# Patient Record
Sex: Female | Born: 1985 | Race: Black or African American | Hispanic: No | Marital: Single | Smoking: Never smoker
Health system: Southern US, Community
[De-identification: ages and names within clinical notes are randomized; demographics above are authoritative.]

## PROBLEM LIST (undated history)

## (undated) DIAGNOSIS — R1030 Lower abdominal pain, unspecified: Secondary | ICD-10-CM

## (undated) DIAGNOSIS — F419 Anxiety disorder, unspecified: Secondary | ICD-10-CM

## (undated) DIAGNOSIS — F32A Depression, unspecified: Secondary | ICD-10-CM

## (undated) DIAGNOSIS — M797 Fibromyalgia: Secondary | ICD-10-CM

## (undated) DIAGNOSIS — R079 Chest pain, unspecified: Secondary | ICD-10-CM

## (undated) HISTORY — DX: Chest pain, unspecified: R07.9

## (undated) HISTORY — DX: Lower abdominal pain, unspecified: R10.30

## (undated) HISTORY — PX: TUBAL LIGATION: SHX77

## (undated) HISTORY — PX: OTHER SURGICAL HISTORY: SHX169

## (undated) HISTORY — PX: CARPAL TUNNEL RELEASE: SHX101

---

## 2007-07-17 DIAGNOSIS — F32A Depression, unspecified: Secondary | ICD-10-CM | POA: Insufficient documentation

## 2008-10-15 DIAGNOSIS — H5710 Ocular pain, unspecified eye: Secondary | ICD-10-CM | POA: Insufficient documentation

## 2009-08-02 DIAGNOSIS — J029 Acute pharyngitis, unspecified: Secondary | ICD-10-CM | POA: Insufficient documentation

## 2009-08-02 DIAGNOSIS — Z23 Encounter for immunization: Secondary | ICD-10-CM | POA: Insufficient documentation

## 2009-08-02 DIAGNOSIS — F439 Reaction to severe stress, unspecified: Secondary | ICD-10-CM | POA: Insufficient documentation

## 2009-08-02 DIAGNOSIS — G4700 Insomnia, unspecified: Secondary | ICD-10-CM | POA: Insufficient documentation

## 2009-08-02 DIAGNOSIS — E669 Obesity, unspecified: Secondary | ICD-10-CM | POA: Insufficient documentation

## 2009-08-02 DIAGNOSIS — B353 Tinea pedis: Secondary | ICD-10-CM | POA: Insufficient documentation

## 2009-09-24 DIAGNOSIS — F419 Anxiety disorder, unspecified: Secondary | ICD-10-CM | POA: Insufficient documentation

## 2009-09-30 DIAGNOSIS — S62609A Fracture of unspecified phalanx of unspecified finger, initial encounter for closed fracture: Secondary | ICD-10-CM | POA: Insufficient documentation

## 2009-10-19 DIAGNOSIS — J309 Allergic rhinitis, unspecified: Secondary | ICD-10-CM | POA: Insufficient documentation

## 2009-12-13 DIAGNOSIS — E669 Obesity, unspecified: Secondary | ICD-10-CM | POA: Insufficient documentation

## 2010-02-10 DIAGNOSIS — J9801 Acute bronchospasm: Secondary | ICD-10-CM | POA: Insufficient documentation

## 2010-03-11 DIAGNOSIS — R519 Headache, unspecified: Secondary | ICD-10-CM | POA: Insufficient documentation

## 2011-07-28 DIAGNOSIS — E785 Hyperlipidemia, unspecified: Secondary | ICD-10-CM | POA: Insufficient documentation

## 2013-11-20 ENCOUNTER — Emergency Department (HOSPITAL_COMMUNITY)
Admission: EM | Admit: 2013-11-20 | Discharge: 2013-11-20 | Disposition: A | Discharge status: Home / Self Care | Payer: Medicaid Other | Attending: Emergency Medicine | Admitting: Emergency Medicine

## 2013-11-20 ENCOUNTER — Encounter (HOSPITAL_COMMUNITY): Payer: Self-pay | Admitting: Emergency Medicine

## 2013-11-20 DIAGNOSIS — R21 Rash and other nonspecific skin eruption: Secondary | ICD-10-CM | POA: Insufficient documentation

## 2013-11-20 DIAGNOSIS — L509 Urticaria, unspecified: Secondary | ICD-10-CM | POA: Insufficient documentation

## 2013-11-20 DIAGNOSIS — Z79899 Other long term (current) drug therapy: Secondary | ICD-10-CM | POA: Insufficient documentation

## 2013-11-20 MED ORDER — PREDNISONE 10 MG PO TABS: ORAL_TABLET | ORAL | Status: DC

## 2013-11-20 MED ORDER — HYDROXYZINE HCL 25 MG PO TABS: 25.0000 mg | ORAL_TABLET | Freq: Four times a day (QID) | ORAL | Status: DC

## 2013-11-20 NOTE — Discharge Instructions (Signed)
Hives Hives are itchy, red, swollen areas of the skin. They can vary in size and location on your body. Hives can come and go for hours or several days (acute hives) or for several weeks (chronic hives). Hives do not spread from person to person (noncontagious). They may get worse with scratching, exercise, and emotional stress. CAUSES   Allergic reaction to food, additives, or drugs.  Infections, including the common cold.  Illness, such as vasculitis, lupus, or thyroid disease.  Exposure to sunlight, heat, or cold.  Exercise.  Stress.  Contact with chemicals. SYMPTOMS   Red or white swollen patches on the skin. The patches may change size, shape, and location quickly and repeatedly.  Itching.  Swelling of the hands, feet, and face. This may occur if hives develop deeper in the skin. DIAGNOSIS  Your caregiver can usually tell what is wrong by performing a physical exam. Skin or blood tests may also be done to determine the cause of your hives. In some cases, the cause cannot be determined. TREATMENT  Mild cases usually get better with medicines such as antihistamines. Severe cases may require an emergency epinephrine injection. If the cause of your hives is known, treatment includes avoiding that trigger.  HOME CARE INSTRUCTIONS   Avoid causes that trigger your hives.  Take antihistamines as directed by your caregiver to reduce the severity of your hives. Non-sedating or low-sedating antihistamines are usually recommended. Do not drive while taking an antihistamine.  Take any other medicines prescribed for itching as directed by your caregiver.  Wear loose-fitting clothing.  Keep all follow-up appointments as directed by your caregiver. SEEK MEDICAL CARE IF:   You have persistent or severe itching that is not relieved with medicine.  You have painful or swollen joints. SEEK IMMEDIATE MEDICAL CARE IF:   You have a fever.  Your tongue or lips are swollen.  You have  trouble breathing or swallowing.  You feel tightness in the throat or chest.  You have abdominal pain. These problems may be the first sign of a life-threatening allergic reaction. Call your local emergency services (911 in [place] MAKE SURE YOU:   Understand these instructions.  Will watch your condition.  Will get help right away if you are not doing well or get worse. Document Released: 01/23/2005 Document Revised: 01/28/2013 Document Reviewed: 04/18/2011 Exit[employer] Patient Information 2015 [employer]. This information is not intended to replace advice given to you by your health care provider. Make sure you discuss any questions you have with your health care provider.  

## 2013-11-20 NOTE — ED Notes (Signed)
"  I have night hives",  Says she breaks out with hives at night , No relief with benadryl. Alert, NO sob. No difficulty swallowing

## 2013-11-21 NOTE — ED Provider Notes (Signed)
CSN: [id number]     Arrival date & time 11/20/13  1249 History   First MD Initiated Contact with Patient 11/20/13 1314     No chief complaint on file.    (Consider location/radiation/quality/duration/timing/severity/associated sxs/prior Treatment) The history is provided by the patient.   Theresa Holt is a 28 y.o. female presenting for treatment of hives.  She has been breaking out in itchy hives every night for the past week.  She has a history of similar symptoms which occurred last fall as well,  Lasting for about a month then resolving and she and her pcp were unable to determine the cause.  She denies any changes in soaps, lotions, chemical, environment, foods.  She also denies facial, mouth, lip, tongue edema, cough, sob.  She has taken benadryl with minimal relief of itching.  She currently has a small persistent patch of rash on her chest, but states had allover hives last night.    History reviewed. No pertinent past medical history. Past Surgical History  Procedure Laterality Date  . Cesarean section    . Tubal ligation     History reviewed. No pertinent family history. History  Substance Use Topics  . Smoking status: Never Smoker   . Smokeless tobacco: Not on file  . Alcohol Use: No   OB History   Grav Para Term Preterm Abortions TAB SAB Ect Mult Living                 Review of Systems  Constitutional: Negative for fever and chills.  HENT: Negative for facial swelling.   Respiratory: Negative for shortness of breath and wheezing.   Skin: Positive for rash.  Neurological: Negative for numbness.      Allergies  Codeine  Home Medications   Prior to Admission medications   Medication Sig Start Date End Date Taking? Authorizing Provider  diphenhydrAMINE (BENADRYL) 25 MG tablet Take 50 mg by mouth at bedtime as needed for itching or sleep.   Yes Historical Provider, MD  Linaclotide (LINZESS) 290 MCG CAPS capsule Take 290 mcg by mouth daily.   Yes Historical  Provider, MD  hydrOXYzine (ATARAX/VISTARIL) 25 MG tablet Take 1 tablet (25 mg total) by mouth every 6 (six) hours. 11/20/13   Burgess AmorJulie Karime Scheuermann, PA-C  predniSONE (DELTASONE) 10 MG tablet 6, 5, 4, 3, 2 then 1 tablet by mouth daily for 6 days total. 11/20/13   Burgess AmorJulie Janei Scheff, PA-C   BP 122/76  Pulse 86  Temp(Src) 99.1 F (37.3 C) (Oral)  Resp 14  SpO2 97%  LMP 11/12/2013 Physical Exam  Nursing note and vitals reviewed. Constitutional: She appears well-developed and well-nourished.  HENT:  Head: Normocephalic and atraumatic.  Eyes: Conjunctivae are normal.  Neck: Normal range of motion.  Cardiovascular: Normal rate, regular rhythm, normal heart sounds and intact distal pulses.   Pulmonary/Chest: Effort normal and breath sounds normal. No stridor. She has no wheezes.  Abdominal: Soft. Bowel sounds are normal. There is no tenderness.  Musculoskeletal: Normal range of motion.  Neurological: She is alert.  Skin: Skin is warm and dry. Rash noted.  Small patch of hive like raised rash left upper chest wall.  Psychiatric: She has a normal mood and affect.    ED Course  Procedures (including critical care time) Labs Review Labs Reviewed - No data to display  Imaging Review No results found.   EKG Interpretation None      MDM   Final diagnoses:  Hives    The patient appears reasonably  screened and/or stabilized for discharge and I doubt any other medical condition or other [facility] requiring further screening, evaluation, or treatment in the ED at this time prior to discharge.  She was placed on prednisone taper, atarax in place of benadryl, suggested pepcid bid for the next week.  F/u with pcp prn, returning here for any worsened sx.  Pt understands and agrees with plan.    Burgess AmorJulie Zorana Brockwell, PA-C 11/21/13 [phone]

## 2013-11-24 NOTE — ED Provider Notes (Signed)
History/physical exam/procedure(s) were performed by non-physician practitioner and as supervising physician I was immediately available for consultation/collaboration. I have reviewed all notes and am in agreement with care and plan.   Hilario Quarryanielle S Nichoel Digiulio, MD 11/24/13 [id number]

## 2016-03-03 DIAGNOSIS — Z3141 Encounter for fertility testing: Secondary | ICD-10-CM | POA: Insufficient documentation

## 2016-11-23 ENCOUNTER — Emergency Department (HOSPITAL_COMMUNITY)
Admission: EM | Admit: 2016-11-23 | Discharge: 2016-11-23 | Disposition: A | Discharge status: Home / Self Care | Payer: Self-pay | Attending: Emergency Medicine | Admitting: Emergency Medicine

## 2016-11-23 ENCOUNTER — Emergency Department (HOSPITAL_COMMUNITY): Payer: Self-pay

## 2016-11-23 ENCOUNTER — Encounter (HOSPITAL_COMMUNITY): Payer: Self-pay | Admitting: Emergency Medicine

## 2016-11-23 DIAGNOSIS — Z79899 Other long term (current) drug therapy: Secondary | ICD-10-CM | POA: Insufficient documentation

## 2016-11-23 DIAGNOSIS — R0789 Other chest pain: Secondary | ICD-10-CM | POA: Insufficient documentation

## 2016-11-23 LAB — COMPREHENSIVE METABOLIC PANEL
ALT: 18 U/L (ref 14–54)
AST: 20 U/L (ref 15–41)
Albumin: 3.5 g/dL (ref 3.5–5.0)
Alkaline Phosphatase: 85 U/L (ref 38–126)
Anion gap: 9 (ref 5–15)
BILIRUBIN TOTAL: 0.3 mg/dL (ref 0.3–1.2)
BUN: 11 mg/dL (ref 6–20)
CALCIUM: 9 mg/dL (ref 8.9–10.3)
CO2: 27 mmol/L (ref 22–32)
CREATININE: 0.79 mg/dL (ref 0.44–1.00)
Chloride: 105 mmol/L (ref 101–111)
Glucose, Bld: 120 mg/dL — ABNORMAL HIGH (ref 65–99)
Potassium: 3.3 mmol/L — ABNORMAL LOW (ref 3.5–5.1)
Sodium: 141 mmol/L (ref 135–145)
TOTAL PROTEIN: 7.4 g/dL (ref 6.5–8.1)

## 2016-11-23 LAB — CBC WITH DIFFERENTIAL/PLATELET
Basophils Absolute: 0 10*3/uL (ref 0.0–0.1)
Basophils Relative: 0 %
EOS PCT: 2 %
Eosinophils Absolute: 0.2 10*3/uL (ref 0.0–0.7)
HEMATOCRIT: 38.6 % (ref 36.0–46.0)
Hemoglobin: 11.9 g/dL — ABNORMAL LOW (ref 12.0–15.0)
LYMPHS PCT: 27 %
Lymphs Abs: 2.4 10*3/uL (ref 0.7–4.0)
MCH: 26.2 pg (ref 26.0–34.0)
MCHC: 30.8 g/dL (ref 30.0–36.0)
MCV: 85 fL (ref 78.0–100.0)
Monocytes Absolute: 0.4 10*3/uL (ref 0.1–1.0)
Monocytes Relative: 5 %
NEUTROS ABS: 5.8 10*3/uL (ref 1.7–7.7)
Neutrophils Relative %: 66 %
Platelets: 353 10*3/uL (ref 150–400)
RBC: 4.54 MIL/uL (ref 3.87–5.11)
RDW: 14 % (ref 11.5–15.5)
WBC: 8.9 10*3/uL (ref 4.0–10.5)

## 2016-11-23 LAB — D-DIMER, QUANTITATIVE (NOT AT ARMC): D-Dimer, Quant: 0.48 ug/mL-FEU (ref 0.00–0.50)

## 2016-11-23 LAB — TROPONIN I

## 2016-11-23 MED ORDER — HYDROCODONE-ACETAMINOPHEN 5-325 MG PO TABS
1.0000 | ORAL_TABLET | Freq: Once | ORAL | Status: AC
  Administered 2016-11-23: 1 via ORAL
  Filled 2016-11-23: qty 1

## 2016-11-23 MED ORDER — IBUPROFEN 800 MG PO TABS: 800.0000 mg | ORAL_TABLET | Freq: Three times a day (TID) | ORAL | 0 refills | Status: DC

## 2016-11-23 MED ORDER — TRAMADOL HCL 50 MG PO TABS: 50.0000 mg | ORAL_TABLET | Freq: Four times a day (QID) | ORAL | 0 refills | Status: DC | PRN

## 2016-11-23 NOTE — ED Provider Notes (Signed)
[facility] EMERGENCY DEPARTMENT Provider Note   CSN: [id number] Arrival date & time: 11/23/16  1057     History   Chief Complaint Chief Complaint  Patient presents with  . Back Pain    HPI Theresa Holt is a 31 y.o. female.    Patient complains of left-sided chest pain and left upper back pain   The history is provided by the patient. No language interpreter was used.  Back Pain   This is a new problem. The current episode started yesterday. The problem occurs constantly. The problem has not changed since onset.The pain is associated with no known injury. Pain location: Left chest and left upper back. The quality of the pain is described as aching. The pain does not radiate. The pain is at a severity of 4/10. The pain is moderate. The symptoms are aggravated by twisting. The pain is the same all the time. Pertinent negatives include no chest pain, no headaches and no abdominal pain.    No past medical history on file.  There are no active problems to display for this patient.   Past Surgical History:  Procedure Laterality Date  . CESAREAN SECTION    . TUBAL LIGATION      OB History    No data available       Home Medications    Prior to Admission medications   Medication Sig Start Date End Date Taking? Authorizing Provider  diphenhydrAMINE (BENADRYL) 25 MG tablet Take 50 mg by mouth at bedtime as needed for itching or sleep.   Yes [provider]  letrozole (FEMARA) 2.5 MG tablet Take 2.5 mg by mouth daily.   Yes [provider]  metFORMIN (GLUCOPHAGE) 500 MG tablet Take 500 mg by mouth 2 (two) times daily with a meal.   Yes [provider]  PRESCRIPTION MEDICATION HCG shot once a month for fertility   Yes [provider]  ibuprofen (ADVIL,MOTRIN) 800 MG tablet Take 1 tablet (800 mg total) by mouth 3 (three) times daily. 11/23/16   Theresa Holt, Theresa Germani, MD  predniSONE (DELTASONE) 10 MG tablet 6, 5, 4, 3, 2 then 1 tablet by mouth  daily for 6 days total. 11/20/13   Idol, Raynelle FanningJulie, PA-C  traMADol (ULTRAM) 50 MG tablet Take 1 tablet (50 mg total) by mouth every 6 (six) hours as needed. 11/23/16   Theresa Holt, Greco Gastelum, MD    Family History No family history on file.  Social History Social History  Substance Use Topics  . Smoking status: Never Smoker  . Smokeless tobacco: Never Used  . Alcohol use No     Allergies   Codeine   Review of Systems Review of Systems  Constitutional: Negative for appetite change and fatigue.  HENT: Negative for congestion, ear discharge and sinus pressure.   Eyes: Negative for discharge.  Respiratory: Negative for cough.   Cardiovascular: Negative for chest pain.  Gastrointestinal: Negative for abdominal pain and diarrhea.  Genitourinary: Negative for frequency and hematuria.  Musculoskeletal: Positive for back pain.  Skin: Negative for rash.  Neurological: Negative for seizures and headaches.  Psychiatric/Behavioral: Negative for hallucinations.     Physical Exam Updated Vital Signs BP 128/88   Pulse 81   Temp 97.8 F (36.6 C) (Temporal)   Resp 20   Ht 5\' 4"  (1.626 m)   Wt 103 kg (227 lb)   LMP 11/15/2016   SpO2 100%   BMI 38.96 kg/m   Physical Exam  Constitutional: She is oriented to person, place, and  time. She appears well-developed.  HENT:  Head: Normocephalic.  Eyes: Conjunctivae and EOM are normal. No scleral icterus.  Neck: Neck supple. No thyromegaly present.  Cardiovascular: Normal rate and regular rhythm.  Exam reveals no gallop and no friction rub.   No murmur heard. Pulmonary/Chest: No stridor. She has no wheezes. She has no rales. She exhibits tenderness.  Abdominal: She exhibits no distension. There is no tenderness. There is no rebound.  Musculoskeletal: Normal range of motion. She exhibits no edema.  Tenderness to left trapezius muscle  Lymphadenopathy:    She has no cervical adenopathy.  Neurological: She is oriented to person, place, and time.  She exhibits normal muscle tone. Coordination normal.  Skin: No rash noted. No erythema.  Psychiatric: She has a normal mood and affect. Her behavior is normal.     ED Treatments / Results  Labs (all labs ordered are listed, but only abnormal results are displayed) Labs Reviewed  CBC WITH DIFFERENTIAL/PLATELET - Abnormal; Notable for the following:       Result Value   Hemoglobin 11.9 (*)    All other components within normal limits  COMPREHENSIVE METABOLIC PANEL - Abnormal; Notable for the following:    Potassium 3.3 (*)    Glucose, Bld 120 (*)    All other components within normal limits  D-DIMER, QUANTITATIVE (NOT AT [facility])  TROPONIN I    EKG  EKG Interpretation None       Radiology Dg Chest 2 View  Result Date: 11/23/2016 CLINICAL DATA:  LEFT chest pain radiating to back for 1 day. EXAM: CHEST  2 VIEW COMPARISON:  None. FINDINGS: The heart size and mediastinal contours are within normal limits. Both lungs are clear. Mild diaphragm hepatic eventration. The visualized skeletal structures are unremarkable. IMPRESSION: Normal chest. Electronically Signed   By: Awilda Metro M.D.   On: 11/23/2016 13:35    Procedures Procedures (including critical care time)  Medications Ordered in ED Medications  HYDROcodone-acetaminophen (NORCO/VICODIN) 5-325 MG per tablet 1 tablet (1 tablet Oral Given 11/23/16 1436)     Initial Impression / Assessment and Plan / ED Course  I have reviewed the triage vital signs and the nursing notes.  Pertinent labs & imaging results that were available during my care of the patient were reviewed by me and considered in my medical decision making (see chart for details).   trapezius muscle strain. Patient will be treated with Motrin and Ultram will follow-up as needed    Final Clinical Impressions(s) / ED Diagnoses   Final diagnoses:  Chest wall pain    New Prescriptions New Prescriptions   IBUPROFEN (ADVIL,MOTRIN) 800 MG TABLET     Take 1 tablet (800 mg total) by mouth 3 (three) times daily.   TRAMADOL (ULTRAM) 50 MG TABLET    Take 1 tablet (50 mg total) by mouth every 6 (six) hours as needed.     Theresa Berkshire, MD 11/23/16 [phone]

## 2016-11-23 NOTE — ED Triage Notes (Signed)
Up on waking the second time this morning , left sided back pain, radiating around into shoulder and upper chest, worse with movement,

## 2016-11-23 NOTE — Discharge Instructions (Signed)
Follow up if not improving

## 2019-09-13 ENCOUNTER — Emergency Department (HOSPITAL_COMMUNITY)
Admission: EM | Admit: 2019-09-13 | Discharge: 2019-09-14 | Disposition: A | Discharge status: Home / Self Care | Payer: Medicaid Other | Attending: Emergency Medicine | Admitting: Emergency Medicine

## 2019-09-13 ENCOUNTER — Encounter (HOSPITAL_COMMUNITY): Payer: Self-pay | Admitting: Emergency Medicine

## 2019-09-13 ENCOUNTER — Other Ambulatory Visit: Payer: Self-pay

## 2019-09-13 DIAGNOSIS — O2341 Unspecified infection of urinary tract in pregnancy, first trimester: Secondary | ICD-10-CM | POA: Diagnosis not present

## 2019-09-13 DIAGNOSIS — Z79899 Other long term (current) drug therapy: Secondary | ICD-10-CM | POA: Insufficient documentation

## 2019-09-13 DIAGNOSIS — N390 Urinary tract infection, site not specified: Secondary | ICD-10-CM

## 2019-09-13 DIAGNOSIS — R1032 Left lower quadrant pain: Secondary | ICD-10-CM | POA: Insufficient documentation

## 2019-09-13 DIAGNOSIS — Z3490 Encounter for supervision of normal pregnancy, unspecified, unspecified trimester: Secondary | ICD-10-CM

## 2019-09-13 DIAGNOSIS — Z3A01 Less than 8 weeks gestation of pregnancy: Secondary | ICD-10-CM | POA: Insufficient documentation

## 2019-09-13 DIAGNOSIS — O26891 Other specified pregnancy related conditions, first trimester: Secondary | ICD-10-CM | POA: Diagnosis present

## 2019-09-13 LAB — COMPREHENSIVE METABOLIC PANEL
ALT: 22 U/L (ref 0–44)
AST: 19 U/L (ref 15–41)
Albumin: 3.8 g/dL (ref 3.5–5.0)
Alkaline Phosphatase: 75 U/L (ref 38–126)
Anion gap: 8 (ref 5–15)
BUN: 10 mg/dL (ref 6–20)
CO2: 22 mmol/L (ref 22–32)
Calcium: 9.1 mg/dL (ref 8.9–10.3)
Chloride: 107 mmol/L (ref 98–111)
Creatinine, Ser: 0.88 mg/dL (ref 0.44–1.00)
GFR calc Af Amer: >60 mL/min (ref >60)
GFR calc non Af Amer: >60 mL/min (ref >60)
Glucose, Bld: 91 mg/dL (ref 70–99)
Potassium: 3.5 mmol/L (ref 3.5–5.1)
Sodium: 137 mmol/L (ref 135–145)
Total Bilirubin: 0.3 mg/dL (ref 0.3–1.2)
Total Protein: 7.7 g/dL (ref 6.5–8.1)

## 2019-09-13 LAB — CBC
HCT: 39.7 % (ref 36.0–46.0)
Hemoglobin: 12.3 g/dL (ref 12.0–15.0)
MCH: 27.3 pg (ref 26.0–34.0)
MCHC: 31 g/dL (ref 30.0–36.0)
MCV: 88.2 fL (ref 80.0–100.0)
Platelets: 337 10*3/uL (ref 150–400)
RBC: 4.5 MIL/uL (ref 3.87–5.11)
RDW: 13.7 % (ref 11.5–15.5)
WBC: 12.2 10*3/uL — ABNORMAL HIGH (ref 4.0–10.5)
nRBC: 0 % (ref 0.0–0.2)

## 2019-09-13 LAB — URINALYSIS, ROUTINE W REFLEX MICROSCOPIC
Bacteria, UA: NONE SEEN
Bilirubin Urine: NEGATIVE
Glucose, UA: NEGATIVE mg/dL
Ketones, ur: NEGATIVE mg/dL
Nitrite: NEGATIVE
Protein, ur: NEGATIVE mg/dL
Specific Gravity, Urine: 1.023 (ref 1.005–1.030)
pH: 5 (ref 5.0–8.0)

## 2019-09-13 LAB — LIPASE, BLOOD: Lipase: 29 U/L (ref 11–51)

## 2019-09-13 LAB — POC URINE PREG, ED: Preg Test, Ur: POSITIVE — AB

## 2019-09-13 NOTE — ED Triage Notes (Signed)
Pt c/o lower left abd pain that started last night, also c/o vomiting.

## 2019-09-14 ENCOUNTER — Emergency Department (HOSPITAL_COMMUNITY): Payer: Medicaid Other

## 2019-09-14 LAB — HCG, QUANTITATIVE, PREGNANCY: hCG, Beta Chain, Quant, S: 242 m[IU]/mL — ABNORMAL HIGH (ref <5)

## 2019-09-14 LAB — ABO/RH: ABO/RH(D): B POS

## 2019-09-14 MED ORDER — CEPHALEXIN 500 MG PO CAPS: 500.0000 mg | ORAL_CAPSULE | Freq: Three times a day (TID) | ORAL | 0 refills | Status: DC

## 2019-09-14 MED ORDER — CEPHALEXIN 500 MG PO CAPS
500.0000 mg | ORAL_CAPSULE | Freq: Once | ORAL | Status: AC
  Administered 2019-09-14: 500 mg via ORAL
  Filled 2019-09-14: qty 1

## 2019-09-14 NOTE — ED Provider Notes (Signed)
[facility] EMERGENCY DEPARTMENT Provider Note   CSN: [id number] Arrival date & time: 09/13/19  2038   Time seen 12:10 AM  History Chief Complaint  Patient presents with  . Abdominal Pain    Theresa Holt is a 34 y.o. female.  HPI   Patient is G5, P3 Ab1 (she thinks she was pregnant in December because she had a positive pregnancy test however she never missed a period).  She is currently being followed by Dr. Almond Lint.  She had laparoscopic removal of her right tube in October and reversal of her left tubal ligation in October.  She had a small area of endometriosis or scar tissue on the right tube.  She states her last normal period started June 24.  She has had a positive pregnancy test.  She states she started having left lower quadrant pain on the night of August 6.  It has been there constantly and is sharp.  It gets worse with changing positions and nothing makes it feel better.  She denies vaginal bleeding.  She has some nausea which she has had before the pain started without vomiting.  She denies fever, dysuria, or hematuria.  She has had frequency for about 2 days.  She has a follow-up appointment with her fertility doctor on August 10 at [facility].  She states she has had some serial beta hCGs done by Dr. Ralph Dowdy her OB in [place].  She does not know her blood type.  PCP System, Pcp Not In   PCP System, Pcp Not In   No past medical history on file.  There are no problems to display for this patient.   Past Surgical History:  Procedure Laterality Date  . CESAREAN SECTION    . TUBAL LIGATION    . tubal reversal       OB History   No obstetric history on file.     No family history on file.  Social History   Tobacco Use  . Smoking status: Never Smoker  . Smokeless tobacco: Never Used  Substance Use Topics  . Alcohol use: No  . Drug use: No    Home Medications Prior to Admission medications   Medication Sig Start Date End Date Taking? Authorizing  Provider  cephALEXin (KEFLEX) 500 MG capsule Take 1 capsule (500 mg total) by mouth 3 (three) times daily. 09/14/19   Devoria Albe, MD  diphenhydrAMINE (BENADRYL) 25 MG tablet Take 50 mg by mouth at bedtime as needed for itching or sleep.    [provider]  ibuprofen (ADVIL,MOTRIN) 800 MG tablet Take 1 tablet (800 mg total) by mouth 3 (three) times daily. 11/23/16   Bethann Berkshire, MD  letrozole [facility]) 2.5 MG tablet Take 2.5 mg by mouth daily.    [provider]  metFORMIN (GLUCOPHAGE) 500 MG tablet Take 500 mg by mouth 2 (two) times daily with a meal.    [provider]  predniSONE (DELTASONE) 10 MG tablet 6, 5, 4, 3, 2 then 1 tablet by mouth daily for 6 days total. 11/20/13   Idol, Raynelle Fanning, PA-C  PRESCRIPTION MEDICATION HCG shot once a month for fertility    [provider]  traMADol (ULTRAM) 50 MG tablet Take 1 tablet (50 mg total) by mouth every 6 (six) hours as needed. 11/23/16   Bethann Berkshire, MD  Patient states she stopped all medications when she became pregnant  Allergies    Codeine  Review of Systems   Review of Systems  All other  systems reviewed and are negative.   Physical Exam Updated Vital Signs BP 128/83   Pulse 86   Temp 98.4 F (36.9 C)   Resp 16   Ht 5\' 4"  (1.626 m)   Wt 99.3 kg   SpO2 100%   BMI 37.59 kg/m   Physical Exam Vitals and nursing note reviewed.  Constitutional:      Appearance: Normal appearance. She is obese.  HENT:     Head: Normocephalic and atraumatic.     Right Ear: External ear normal.     Left Ear: External ear normal.  Eyes:     Extraocular Movements: Extraocular movements intact.     Conjunctiva/sclera: Conjunctivae normal.     Pupils: Pupils are equal, round, and reactive to light.  Cardiovascular:     Rate and Rhythm: Normal rate and regular rhythm.  Pulmonary:     Effort: Pulmonary effort is normal. No respiratory distress.     Breath sounds: Normal breath sounds.  Abdominal:     General:  Bowel sounds are normal.     Palpations: Abdomen is soft.     Tenderness: There is abdominal tenderness in the left lower quadrant. There is no guarding or rebound.    Musculoskeletal:        General: Normal range of motion.     Cervical back: Normal range of motion.  Skin:    General: Skin is warm and dry.  Neurological:     General: No focal deficit present.     Mental Status: She is alert and oriented to person, place, and time.     Cranial Nerves: No cranial nerve deficit.  Psychiatric:        Mood and Affect: Mood normal.        Behavior: Behavior normal.        Thought Content: Thought content normal.     ED Results / Procedures / Treatments   Labs (all labs ordered are listed, but only abnormal results are displayed) Results for orders placed or performed during the hospital encounter of 09/13/19  Lipase, blood  Result Value Ref Range   Lipase 29 11 - 51 U/L  Comprehensive metabolic panel  Result Value Ref Range   Sodium 137 135 - 145 mmol/L   Potassium 3.5 3.5 - 5.1 mmol/L   Chloride 107 98 - 111 mmol/L   CO2 22 22 - 32 mmol/L   Glucose, Bld 91 70 - 99 mg/dL   BUN 10 6 - 20 mg/dL   Creatinine, Ser 11/13/19 0.44 - 1.00 mg/dL   Calcium 9.1 8.9 - 5.83 mg/dL   Total Protein 7.7 6.5 - 8.1 g/dL   Albumin 3.8 3.5 - 5.0 g/dL   AST 19 15 - 41 U/L   ALT 22 0 - 44 U/L   Alkaline Phosphatase 75 38 - 126 U/L   Total Bilirubin 0.3 0.3 - 1.2 mg/dL   GFR calc non Af Amer >60 >60 mL/min   GFR calc Af Amer >60 >60 mL/min   Anion gap 8 5 - 15  CBC  Result Value Ref Range   WBC 12.2 (H) 4.0 - 10.5 K/uL   RBC 4.50 3.87 - 5.11 MIL/uL   Hemoglobin 12.3 12.0 - 15.0 g/dL   HCT 09.4 36 - 46 %   MCV 88.2 80.0 - 100.0 fL   MCH 27.3 26.0 - 34.0 pg   MCHC 31.0 30.0 - 36.0 g/dL   RDW 07.6 80.8 - 81.1 %   Platelets 337 150 -  400 K/uL   nRBC 0.0 0.0 - 0.2 %  Urinalysis, Routine w reflex microscopic Urine, Clean Catch  Result Value Ref Range   Color, Urine YELLOW YELLOW   APPearance  HAZY (A) CLEAR   Specific Gravity, Urine 1.023 1.005 - 1.030   pH 5.0 5.0 - 8.0   Glucose, UA NEGATIVE NEGATIVE mg/dL   Hgb urine dipstick MODERATE (A) NEGATIVE   Bilirubin Urine NEGATIVE NEGATIVE   Ketones, ur NEGATIVE NEGATIVE mg/dL   Protein, ur NEGATIVE NEGATIVE mg/dL   Nitrite NEGATIVE NEGATIVE   Leukocytes,Ua MODERATE (A) NEGATIVE   RBC / HPF 11-20 0 - 5 RBC/hpf   WBC, UA 21-50 0 - 5 WBC/hpf   Bacteria, UA NONE SEEN NONE SEEN   Squamous Epithelial / LPF 0-5 0 - 5   Mucus PRESENT   hCG, quantitative, pregnancy  Result Value Ref Range   hCG, Beta Chain, Quant, S 242 (H) <5 mIU/mL  POC urine preg, ED  Result Value Ref Range   Preg Test, Ur POSITIVE (A) NEGATIVE  ABO/Rh  Result Value Ref Range   ABO/RH(D)      B POS Performed at [facility], [address], [place], [place] [address]    Laboratory interpretation all normal except possible UTI.    EKG None  Radiology US OB LESS THAN 14 WEEKS WITH OB TRANSVAGINAL  Result Date: 09/14/2019 CLINICAL DATA:  Left lower quadrant pain for 1 day. Estimated gestational age by LMP is 6 weeks 3 days. Positive urine pregnancy test. No quantitative beta hCG obtained. EXAM: OBSTETRIC <14 WK [place] AND TRANSVAGINAL OB US TECHNIQUE: Both transabdominal and transvaginal ultrasound examinations were performed for complete evaluation of the gestation as well as the maternal uterus, adnexal regions, and pelvic cul-de-sac. Transvaginal technique was performed to assess early pregnancy. COMPARISON:  09/11/2019 FINDINGS: Intrauterine gestational sac: No intrauterine gestational sac is identified. Yolk sac:  Not identified. Embryo:  Not identified. Cardiac Activity: Not identified. Maternal uterus/adnexae: No myometrial mass lesions are identified. Fluid in the endometrial stripe. Endometrium is thickened at 2.5 cm. Similar appearance to previous study. Right ovary measures 4.2 x 3.4 by 3.6 cm. Cyst measuring 2.3 cm diameter, likely corpus luteal cyst. Left  ovary measures 2.1 x 1.6 by 1.9 cm. Normal appearance. No abnormal adnexal masses. Moderate free fluid is demonstrated in the abdomen. IMPRESSION: Pregnancy of unknown location. No intrauterine gestational sac is identified. Differential diagnosis could include early intrauterine pregnancy, too small to see, occult ectopic pregnancy, or failed pregnancy. Recommend correlation with serum quantitative beta HCG levels and follow-up ultrasound in 7-10 days or as clinically indicated. Electronically Signed   By: Burman Nieves M.D.   On: 09/14/2019 01:33    Procedures Procedures (including critical care time)  Medications Ordered in ED Medications  cephALEXin (KEFLEX) capsule 500 mg (has no administration in time range)    ED Course  I have reviewed the triage vital signs and the nursing notes.  Pertinent labs & imaging results that were available during my care of the patient were reviewed by me and considered in my medical decision making (see chart for details).    MDM Rules/Calculators/A&P                          Patient states she has taken Tylenol for pain.  Otherwise she denies taking any other medications.  Patient had pelvic ultrasound done on August 5 with pregnancy of unknown anatomical location (no intrauterine gas stational  sac or adnexal mass identified.  Differential diagnosis included recent spontaneous abortion, IUP too early to visualize, and nonvisualized ectopic pregnancy.  He documents her beta hCG on August 2 was 46 and on August 4 was 103.  He documents on August 5 she had left lower quadrant pain at that time.  03:47-3:53 patient was discussed with Dr. Donzetta MattersGalloway, on-call for Dr. Ralph DowdyBuist.  She has reviewed her records there, she states that her beta hCGs are going up.  They did see fluid on her ultrasound there and it is not sure if the fluid is increasing or about the same.  She also had a corpus luteum cyst seen on ultrasound on her right ovary on their study on the fifth.   She states patient can take Tylenol and go to their hospital if her symptoms get worse.  Final Clinical Impression(s) / ED Diagnoses Final diagnoses:  Pregnancy  LLQ pain  Less than [redacted] weeks gestation of pregnancy  Urinary tract infection without hematuria, site unspecified    Rx / DC Orders ED Discharge Orders         Ordered    cephALEXin (KEFLEX) 500 MG capsule  3 times daily     Discontinue  Reprint     09/14/19 0356          Plan discharge  Devoria AlbeIva Alija Riano, MD, Concha PyoFACEP    Jyll Tomaro, MD 09/14/19 [phone]

## 2019-09-14 NOTE — Discharge Instructions (Addendum)
I talked to Dr. Donzetta Matters in [facility].  We have reviewed your laboratory tests and your ultrasounds.  Again it is too early to tell right now where your pregnancy is located.  You can take Tylenol safely for pain.  If you should get severe pain or vaginal bleeding you should go to the hospital in [place] to be seen by your OB/GYN.  Keep your appointment on Tuesday, August 10.  Your blood type is B+.  Your quantitative beta hCG tonight is 242.  No sex until your doctor say it is safe. Your urine looks like you may have a urinary tract infection.  Please take the antibiotics until gone.  A urinary tract infection can make you miscarry.

## 2019-09-15 LAB — URINE CULTURE
Culture: <10000 — AB
Special Requests: NORMAL

## 2020-03-18 ENCOUNTER — Ambulatory Visit: Payer: Medicaid Other

## 2020-03-18 ENCOUNTER — Other Ambulatory Visit: Payer: Self-pay

## 2020-03-18 ENCOUNTER — Ambulatory Visit: Payer: Medicaid Other | Attending: Sports Medicine

## 2020-03-18 DIAGNOSIS — M25511 Pain in right shoulder: Secondary | ICD-10-CM | POA: Diagnosis present

## 2020-03-18 DIAGNOSIS — M5412 Radiculopathy, cervical region: Secondary | ICD-10-CM | POA: Insufficient documentation

## 2020-03-18 DIAGNOSIS — M62838 Other muscle spasm: Secondary | ICD-10-CM | POA: Insufficient documentation

## 2020-03-18 DIAGNOSIS — M6281 Muscle weakness (generalized): Secondary | ICD-10-CM

## 2020-03-18 DIAGNOSIS — M542 Cervicalgia: Secondary | ICD-10-CM | POA: Insufficient documentation

## 2020-03-18 DIAGNOSIS — M25611 Stiffness of right shoulder, not elsewhere classified: Secondary | ICD-10-CM | POA: Insufficient documentation

## 2020-03-19 NOTE — Therapy (Signed)
[facility] [address] [place], [place], [address] Phone: [phone]   Fax:  [phone]  Physical Therapy Evaluation  Patient Details  Name: Theresa Holt MRN: [id number] Date of Birth: 07/01/1985 Referring Provider (PT): Pati Gallo, MD   Encounter Date: 03/18/2020   PT End of Session - 03/19/20 2119    Visit Number 1    Number of Visits 7    Date for PT Re-Evaluation 05/01/20    Authorization Type [place] [employer]    PT Start Time 1045    PT Stop Time 1130    PT Time Calculation (min) 45 min    Activity Tolerance Patient tolerated treatment well    Behavior During Therapy [facility] for tasks assessed/performed           History reviewed. No pertinent past medical history.  Past Surgical History:  Procedure Laterality Date  . CESAREAN SECTION    . TUBAL LIGATION    . tubal reversal      There were no vitals filed for this visit.       03/18/2020    1047    Symptoms/Limitations    Subjective "The pain that I'm having make these fingers numb (3rd and 4th digits), if I turn this way (R cervical ROT) I can feel it, I can't lift anything heavy like it hurts if I am holding a 2 liter, and I get muscle spasms if I lay on my right side." Pt reports being intoxicated when she fell 02/07/2020 and is not sure how she landed but reports having her neck and shoulder pain since the incident.   Patient is accompained by: --   Pertinent History --   Limitations House hold activitiesLimitations. House hold activities. The comment is difficulty when using R arm but denies limitations otherwise. Taken on 03/18/20 1047   How long can you sit comfortably? Denies limitations   How long can you stand comfortably? Denies limitations   How long can you walk comfortably? Denies limitations   Diagnostic tests --   Patient Stated Goals Bowling and not having pain   Pain Assessment    Currently in Pain? Yes   Pain Score 5    Pain  Location Shoulder   Pain Orientation Right; AnteriorPain Orientation. Right; Anterior. The comment is top and front of shoulder. Taken on 03/18/20 1047   Pain Descriptors / Indicators Dull; Sharp; Spasm   Pain Type Acute painPain Type. Acute pain. The comment is fell 02/07/2020. Taken on 03/18/20 1047   Pain Radiating Towards Constant N/T in right 3rd and 4th digits   Pain Onset More than a month ago   Pain Frequency Constant   Aggravating Factors  R cervical ROT, using R hand/arm aggravates symptoms   Pain Relieving Factors Nothing - has tried ice and heating pad with no relief   Effect of Pain on Daily Activities Bothersome because she has pain that wasn't there before - increased pain when using RUE            [facility] PT Assessment - 03/19/20 0001      Assessment   Medical Diagnosis Cervical radiculopathy; cervical strain; R RTC strain    Referring Provider (PT) Pati Gallo, MD    Onset Date/Surgical Date 02/07/20    Hand Dominance Right    Next MD Visit MRI scheduled 04/05/2020    Prior Therapy No      Precautions   Precautions None      Restrictions  Weight Bearing Restrictions No      Home Environment   Living Environment Private residence    Living Arrangements Spouse/significant other   boyfriend   Available Help at Discharge Family    Type of Home Apartment    Home Access Level entry    Home Layout Two level    Alternate Level Stairs-Number of Steps 16    Alternate Level Stairs-Rails None    Home Equipment None      Prior Function   Level of Independence Independent    Vocation Full time employment    Vocation Requirements Works from home on [employer]    Leisure Shop, bowling, go out to eat      Cognition   Overall Cognitive Status Within Functional Limits for tasks assessed      Observation/Other Assessments   Focus on Therapeutic Outcomes (FOTO)  No FOTO - MCD      Posture/Postural Control   Posture Comments Pt sits with forward head, rounded shoulders  and slouched posture in chair during eval      AROM   Overall AROM Comments Pain with active R shoulder ABD, horizontal ABD and ADD    Right Shoulder Flexion 140 Degrees    Right Shoulder ABduction 135 Degrees    Right Shoulder Internal Rotation --   L5 with pain in anterior/superior shoulder   Right Shoulder External Rotation --   C4 with pain along superior aspect of AC joint with TTP along distal clavicle   Right Shoulder Horizontal ABduction 9 Degrees    Right Shoulder Horizontal  ADduction 101 Degrees    Left Shoulder Flexion 164 Degrees    Left Shoulder ABduction 155 Degrees    Left Shoulder Internal Rotation --   T10   Left Shoulder External Rotation --   T5   Left Shoulder Horizontal ABduction 41 Degrees    Left Shoulder Horizontal ADduction 106 Degrees    Cervical Flexion 50    Cervical Extension 56   feels "pulling" in R upper trap   Cervical - Right Side Bend 58    Cervical - Left Side Bend 34    Cervical - Right Rotation 58    Cervical - Left Rotation 62      Strength   Overall Strength Comments Pain with resisted R shoulder FL and ABD, and overhead motion    Right Shoulder Flexion 4-/5    Right Shoulder ABduction 4-/5    Right Shoulder Internal Rotation 4/5    Right Shoulder External Rotation 4/5    Left Shoulder Flexion 4+/5    Left Shoulder ABduction 4+/5    Left Shoulder Internal Rotation 4+/5    Left Shoulder External Rotation 4+/5    Right Elbow Flexion 4+/5    Right Elbow Extension 4+/5    Left Elbow Flexion 4+/5    Left Elbow Extension 4+/5      Palpation   Palpation comment TTP along R upper trap, lower cervical spine SP, and along 1st rib and distal clavicle      Special Tests   Other special tests No change in symptoms with Spurling's compression vs distraction reported                      Objective measurements completed on examination: See above findings.       [facility] - 03/19/20 0001      Self-Care    Self-Care Other Self-Care Comments    Other Self-Care Comments  See patient education      Exercises   Exercises Shoulder;Neck      Shoulder Exercises: Seated   Retraction Strengthening;Both;5 reps      Shoulder Exercises: Stretch   Corner Stretch Limitations Doorway stretch 1 x 30 sec      Neck Exercises: Stretches   Upper Trapezius Stretch Right;1 rep;30 seconds    Levator Stretch Right;1 rep;30 seconds                  PT Education - 03/19/20 2118    Education Details HEP, anatomy of condition/objective findings, and POC    Person(s) Educated Patient    Methods Explanation;Demonstration;Tactile cues;Verbal cues;Handout    Comprehension Verbalized understanding;Returned demonstration            PT Short Term Goals - 03/19/20 2145      PT SHORT TERM GOAL #1   Title Pt will be independent with initial HEP    Baseline Provided initial HEP at evaluation 03/18/2020    Time 3    Period Weeks    Status New    Target Date 04/09/20      PT SHORT TERM GOAL #2   Title Pt will be able to lift and hold 5# dumbbell with </= 4/10 pain in R shoulder.    Baseline 5/10 pain at rest during eval and reports of pain when lifting a 2 liter (~ 4 lbs)    Time 3    Period Weeks    Status New    Target Date 04/09/20      PT SHORT TERM GOAL #3   Title Pt will report minimal to no radicular symptoms in R 3rd and 4th digits throughout the day.    Baseline Pt reports constant N/T in R 3rd and 4th digits    Time 3    Period Weeks    Status New    Target Date 04/09/20      PT SHORT TERM GOAL #4   Title Pt will increase cervical L sidebending AROM to at least 45 degrees with </= 3/10 R shoulder or neck pain.    Baseline L SB 34 degrees; R SB 58 degrees    Time 3    Period Weeks    Status New    Target Date 04/09/20             PT Long Term Goals - 03/19/20 2152      PT LONG TERM GOAL #1   Title Pt will be independent with advanced HEP.    Baseline Provided initial  HEP at evaluation 03/18/2020    Time 6    Period Weeks    Status New    Target Date 04/30/20      PT LONG TERM GOAL #2   Title Pt will be able to lift and hold 8# dumbbell with </= 2/10 pain in R shoulder.    Baseline 5/10 pain at rest during eval and reports of pain when lifting a 2 liter (~ 4 lbs)    Time 6    Period Weeks    Status New    Target Date 04/30/20      PT LONG TERM GOAL #3   Title Pt will demonstrate R shoulder AROM WFL in all planes with </= 2/10 pain or discomfort.    Baseline See flowsheet    Time 6    Period Weeks    Status New    Target Date 04/30/20  PT LONG TERM GOAL #4   Title Pt will increase R shoulder MMT grossly to at least 4+/5    Baseline See flowsheet    Time 6    Period Weeks    Status New    Target Date 04/30/20      PT LONG TERM GOAL #5   Title Pt will report being able to return to recreational bowling with minimal to no R shoulder or neck pain.    Baseline Pt currently unable to bowl as desired and has not attempted due to her pain.    Time 6    Period Weeks    Status New    Target Date 04/30/20                  Plan - 03/19/20 2120    Clinical Impression Statement Patient is a 35 year old female who presents to OPPT with neck and R shoulder pain with MOI of fall while intoxicated 02/07/2020. She has pain with resisted shoulder elevation and right cervical rotation. She has been experiences radicular symptoms to 3rd and 4th digits since her fall. Imaging from her ED visit revealed R shoulder and cervical spine negative for acute fracture or traumatic malalignment with mild C6/C7 disc space narrowing, suggesting degenerative disc disease. She presents with limitations in active R shoulder mobility compared to L with pain during resisted R shoulder FL and ABD. Pt also has limited L sidebending secondary to tight and guarded R upper trap and painful active R cervical rotation. She expressed TTP along lower cervical SP, R upper trap,  distal clavicle, and 1st rib upon assessment with persistent N/T in ulnar nerve distribution throughout session. She should benefit from skilled PT intervention to address deficits and allow to return to functional and recreational activities, such as holding items with R hand and bowling again without being limited by pain.    Personal Factors and Comorbidities Fitness    Examination-Activity Limitations Carry;Sleep;Lift    Examination-Participation Restrictions Cleaning;Community Activity;Meal Prep;Shop;Occupation    Stability/Clinical Decision Making Evolving/Moderate complexity    Clinical Decision Making Moderate    Rehab Potential Good    PT Frequency 1x / week    PT Duration 6 weeks    PT Treatment/Interventions ADLs/Self Care Home Management;Cryotherapy;Electrical Stimulation;Iontophoresis 4mg /ml Dexamethasone;Moist Heat;Traction;Neuromuscular re-education;Therapeutic activities;Functional mobility training;Stair training;Gait training;Patient/family education;Manual techniques;Energy conservation;Dry needling;Passive range of motion;Taping    PT Next Visit Plan Further assess cervical PAIVM, grip strength (dynamometer), cervical retraction, ULTT3, STM as needed, periscapular/RTC strengthening as tolerated. Postural awareness and control    PT Home Exercise Plan [id number] - upper trap and levator scap stretches, scapular retraction, doorway pec stretch    Consulted and Agree with Plan of Care Patient           Patient will benefit from skilled therapeutic intervention in order to improve the following deficits and impairments:  Decreased mobility,Decreased strength,Impaired sensation,Postural dysfunction,Improper body mechanics,Decreased endurance,Decreased range of motion,Increased muscle spasms,Impaired UE functional use,Pain,Decreased activity tolerance  Visit Diagnosis: Cervicalgia  Radiculopathy, cervical region  Acute pain of right shoulder  Stiffness of right shoulder, not  elsewhere classified  Muscle weakness (generalized)  Other muscle spasm     Problem List There are no problems to display for this patient.   Check all possible CPT codes: - Therapeutic Exercise, [phone]- Neuro Re-education, [phone] - Gait Training, [phone] - Manual Therapy, 97530 - Therapeutic Activities, 97535 - Self Care, [phone] - Electrical stimulation (Manual), 44010 - Iontophoresis, [id number] - Ultrasound and 97750 -  Physical performance training         Rhea Bleacher, PT, DPT 03/19/20 9:59 PM  [facility] [address] [place], [place], [address] Phone: [phone]   Fax:  [phone]  Name: Theresa Holt MRN: [id number] Date of Birth: 11-22-1985

## 2020-04-02 ENCOUNTER — Ambulatory Visit: Payer: Medicaid Other | Admitting: Physical Therapy

## 2020-04-10 ENCOUNTER — Emergency Department (HOSPITAL_COMMUNITY)
Admission: EM | Admit: 2020-04-10 | Discharge: 2020-04-11 | Disposition: A | Discharge status: Home / Self Care | Payer: Medicaid Other | Attending: Emergency Medicine | Admitting: Emergency Medicine

## 2020-04-10 DIAGNOSIS — R42 Dizziness and giddiness: Secondary | ICD-10-CM | POA: Diagnosis not present

## 2020-04-10 DIAGNOSIS — R1013 Epigastric pain: Secondary | ICD-10-CM | POA: Insufficient documentation

## 2020-04-10 DIAGNOSIS — R0789 Other chest pain: Secondary | ICD-10-CM | POA: Diagnosis not present

## 2020-04-10 DIAGNOSIS — M79602 Pain in left arm: Secondary | ICD-10-CM | POA: Insufficient documentation

## 2020-04-10 DIAGNOSIS — R079 Chest pain, unspecified: Secondary | ICD-10-CM

## 2020-04-10 NOTE — ED Triage Notes (Signed)
Pt c/o L sided chest discomfort, L arm pain and epigastric pain tonight @ 2130 while trying to go to sleep.

## 2020-04-11 ENCOUNTER — Emergency Department (HOSPITAL_COMMUNITY): Payer: Medicaid Other

## 2020-04-11 ENCOUNTER — Encounter (HOSPITAL_COMMUNITY): Payer: Self-pay | Admitting: Emergency Medicine

## 2020-04-11 LAB — BASIC METABOLIC PANEL
Anion gap: 8 (ref 5–15)
BUN: 11 mg/dL (ref 6–20)
CO2: 22 mmol/L (ref 22–32)
Calcium: 8.8 mg/dL — ABNORMAL LOW (ref 8.9–10.3)
Chloride: 108 mmol/L (ref 98–111)
Creatinine, Ser: 0.87 mg/dL (ref 0.44–1.00)
GFR, Estimated: >60 mL/min (ref >60)
Glucose, Bld: 93 mg/dL (ref 70–99)
Potassium: 4.8 mmol/L (ref 3.5–5.1)
Sodium: 138 mmol/L (ref 135–145)

## 2020-04-11 LAB — CBC
HCT: 38.2 % (ref 36.0–46.0)
Hemoglobin: 11.6 g/dL — ABNORMAL LOW (ref 12.0–15.0)
MCH: 27.3 pg (ref 26.0–34.0)
MCHC: 30.4 g/dL (ref 30.0–36.0)
MCV: 89.9 fL (ref 80.0–100.0)
Platelets: 324 10*3/uL (ref 150–400)
RBC: 4.25 MIL/uL (ref 3.87–5.11)
RDW: 13.5 % (ref 11.5–15.5)
WBC: 9.2 10*3/uL (ref 4.0–10.5)
nRBC: 0 % (ref 0.0–0.2)

## 2020-04-11 LAB — TROPONIN I (HIGH SENSITIVITY): Troponin I (High Sensitivity): 2 ng/L (ref <18)

## 2020-04-11 LAB — I-STAT BETA HCG BLOOD, ED (MC, WL, AP ONLY): I-stat hCG, quantitative: <5 m[IU]/mL (ref <5)

## 2020-04-11 NOTE — Discharge Instructions (Addendum)
Your evaluation in the ED has been reassuring and did not reveal a concerning cause of your symptoms.  Follow-up with your primary care doctor and return if symptoms persist or worsen.

## 2020-04-11 NOTE — ED Notes (Signed)
The pt  Is c/o pain in her lt forearm approx 2100.  Minimal pain now she had abd chest and back pain alos at  The same time no distress no previous problems  No acute distress

## 2020-04-11 NOTE — ED Provider Notes (Signed)
[facility] EMERGENCY DEPARTMENT Provider Note   CSN: [id number] Arrival date & time: 04/10/20  2211     History Chief Complaint  Patient presents with  . Chest Pain    Theresa Holt is a 35 y.o. female.  35 year old female with a history of fibromyalgia (on chronic Norco) presents to the emergency department for evaluation of left forearm pain.  She reports that pain began at 2100 and remained fairly constant.  It began to worsen and she developed pain in her epigastrium and lower chest as well.  Felt slightly lightheaded.  Tried taking her Norco for her pain without improvement.  Decided to come to the ED to seek evaluation.  Her chest pain is largely subsided.  Arm pain continues to persist, but is less severe.  She is right-hand dominant and denies any recent trauma or injury.  No fever, syncope, near syncope, nausea, vomiting, cough, hemoptysis, leg swelling.  No known personal or family cardiac history.  The history is provided by the patient. No language interpreter was used.  Chest Pain      History reviewed. No pertinent past medical history.  There are no problems to display for this patient.   Past Surgical History:  Procedure Laterality Date  . CESAREAN SECTION    . TUBAL LIGATION    . tubal reversal       OB History   No obstetric history on file.     No family history on file.  Social History   Tobacco Use  . Smoking status: Never Smoker  . Smokeless tobacco: Never Used  Substance Use Topics  . Alcohol use: No  . Drug use: No    Home Medications Prior to Admission medications   Medication Sig Start Date End Date Taking? Authorizing Provider  cephALEXin (KEFLEX) 500 MG capsule Take 1 capsule (500 mg total) by mouth 3 (three) times daily. Patient not taking: Reported on 03/18/2020 09/14/19   Devoria Albe, MD  diphenhydrAMINE (BENADRYL) 25 MG tablet Take 50 mg by mouth at bedtime as needed for itching or sleep. Patient not taking:  Reported on 03/18/2020    [provider]  HYDROcodone-acetaminophen (NORCO/VICODIN) 5-325 MG tablet Take 1 tablet by mouth every 6 (six) hours as needed for moderate pain.    [provider]  ibuprofen (ADVIL,MOTRIN) 800 MG tablet Take 1 tablet (800 mg total) by mouth 3 (three) times daily. Patient not taking: Reported on 03/18/2020 11/23/16   Bethann Berkshire, MD  letrozole [facility]) 2.5 MG tablet Take 2.5 mg by mouth daily.    [provider]  metFORMIN (GLUCOPHAGE) 500 MG tablet Take 500 mg by mouth 2 (two) times daily with a meal. Patient not taking: Reported on 03/18/2020    [provider]  methocarbamol (ROBAXIN) 500 MG tablet Take 500 mg by mouth 4 (four) times daily.    [provider]  predniSONE (DELTASONE) 10 MG tablet 6, 5, 4, 3, 2 then 1 tablet by mouth daily for 6 days total. Patient not taking: Reported on 03/18/2020 11/20/13   Burgess Amor, PA-C  PRESCRIPTION MEDICATION HCG shot once a month for fertility Patient not taking: Reported on 03/18/2020    [provider]  traMADol (ULTRAM) 50 MG tablet Take 1 tablet (50 mg total) by mouth every 6 (six) hours as needed. Patient not taking: Reported on 03/18/2020 11/23/16   Bethann Berkshire, MD    Allergies    Codeine  Review of Systems   Review of Systems  Cardiovascular: Positive for chest pain.  Ten systems reviewed and are negative for acute change, except as noted in the HPI.    Physical Exam Updated Vital Signs BP 121/76 (BP Location: Left Arm)   Pulse 70   Temp 97.9 F (36.6 C) (Oral)   Resp 18   LMP 04/06/2020   SpO2 99%   Physical Exam Vitals and nursing note reviewed.  Constitutional:      General: She is not in acute distress.    Appearance: She is well-developed and well-nourished. She is not diaphoretic.     Comments: Nontoxic appearing and in NAD  HENT:     Head: Normocephalic and atraumatic.  Eyes:     General: No scleral icterus.    Extraocular  Movements: EOM normal.     Conjunctiva/sclera: Conjunctivae normal.  Cardiovascular:     Rate and Rhythm: Normal rate and regular rhythm.     Pulses: Normal pulses.  Pulmonary:     Effort: Pulmonary effort is normal. No respiratory distress.     Breath sounds: No stridor. No wheezing, rhonchi or rales.     Comments: Respirations even and unlabored Musculoskeletal:        General: Normal range of motion.     Cervical back: Normal range of motion.     Comments: Normal ROM of the LUE without edema of the extremity. No focal TTP, crepitus, deformity. Preserved strength against resistance.  Skin:    General: Skin is warm and dry.     Coloration: Skin is not pale.     Findings: No erythema or rash.  Neurological:     Mental Status: She is alert and oriented to person, place, and time.     Coordination: Coordination normal.     Comments: Sensation to light touch intact BUE. Grip strength 5/5.  Psychiatric:        Mood and Affect: Mood and affect normal.        Behavior: Behavior normal.     ED Results / Procedures / Treatments   Labs (all labs ordered are listed, but only abnormal results are displayed) Labs Reviewed  BASIC METABOLIC PANEL - Abnormal; Notable for the following components:      Result Value   Calcium 8.8 (*)    All other components within normal limits  CBC - Abnormal; Notable for the following components:   Hemoglobin 11.6 (*)    All other components within normal limits  I-STAT BETA HCG BLOOD, ED ([facility], [facility], [facility] ONLY)  TROPONIN I (HIGH SENSITIVITY)    EKG EKG Interpretation  Date/Time:  Saturday April 10 2020 23:46:38 EST Ventricular Rate:  74 PR Interval:  144 QRS Duration: 82 QT Interval:  396 QTC Calculation: 439 R Axis:   -7 Text Interpretation: Normal sinus rhythm with sinus arrhythmia Low voltage QRS Anterior injury pattern Abnormal ECG Confirmed by Gerhard Munch [phone]) on 04/11/2020 9:10:29 PM   Radiology DG Chest 2 View  Result Date:  04/11/2020 CLINICAL DATA:  Chest pain EXAM: CHEST - 2 VIEW COMPARISON:  11/23/2016 FINDINGS: No consolidation, features of edema, pneumothorax, or effusion. Pulmonary vascularity is normally distributed. The cardiomediastinal contours are unremarkable. No acute osseous or soft tissue abnormality. IMPRESSION: No acute cardiopulmonary abnormality. Electronically Signed   By: Kreg Shropshire M.D.   On: 04/11/2020 00:45    Procedures Procedures   Medications Ordered in ED Medications - No data to display  ED Course  I have reviewed the triage vital signs and the nursing notes.  Pertinent labs & imaging results that were available during my care of the patient were reviewed by me and considered in my medical decision making (see chart for details).    MDM Rules/Calculators/A&P                          35 year old female presents to the emergency department for evaluation of atraumatic left arm pain.  Had some chest discomfort and lightheadedness associated with onset of her discomfort, but this has subsided.  Arm pain has persisted.  She is neurovascularly intact without decreased range of motion, deformity.  Cardiac work-up was initiated in triage.  Her EKG is nonischemic.  Troponin negative.  Chest x-ray without acute cardiopulmonary abnormality.  Does have a history of fibromyalgia.  Symptoms may be an exacerbation of this.  Low suspicion for emergent process and feel the patient is appropriate for continued follow-up with her primary care doctor.  Advised supportive care for pain.  She is on chronic hydrocodone.  Return precautions discussed and provided. Patient discharged in stable condition with no unaddressed concerns.   Final Clinical Impression(s) / ED Diagnoses Final diagnoses:  Left arm pain  Nonspecific chest pain    Rx / DC Orders ED Discharge Orders    None       Antony Madura, PA-C 04/28/20 [id number]    Gilda Crease, MD 04/29/20 [phone]

## 2020-04-12 ENCOUNTER — Ambulatory Visit: Payer: Medicaid Other | Attending: Sports Medicine

## 2020-04-12 ENCOUNTER — Other Ambulatory Visit: Payer: Self-pay

## 2020-04-12 DIAGNOSIS — M25611 Stiffness of right shoulder, not elsewhere classified: Secondary | ICD-10-CM

## 2020-04-12 DIAGNOSIS — M25511 Pain in right shoulder: Secondary | ICD-10-CM | POA: Insufficient documentation

## 2020-04-12 DIAGNOSIS — M6281 Muscle weakness (generalized): Secondary | ICD-10-CM

## 2020-04-12 DIAGNOSIS — M542 Cervicalgia: Secondary | ICD-10-CM | POA: Insufficient documentation

## 2020-04-12 DIAGNOSIS — M5412 Radiculopathy, cervical region: Secondary | ICD-10-CM | POA: Diagnosis present

## 2020-04-12 DIAGNOSIS — M62838 Other muscle spasm: Secondary | ICD-10-CM

## 2020-04-12 NOTE — Therapy (Signed)
[facility] Outpatient Rehabilitation [facility] [address] [place], [place], [address] Phone: [phone]   Fax:  [phone]  Physical Therapy Treatment  Patient Details  Name: Theresa Holt MRN: [id number] Date of Birth: 1985/09/04 Referring Provider (PT): Pati Gallo, MD   Encounter Date: 04/12/2020   PT End of Session - 04/12/20 1135    Visit Number 2    Number of Visits 7    Date for PT Re-Evaluation 05/01/20    Authorization Type [place] [employer]    Authorization Time Period no auth required for initial 12 visits    Authorization - Visit Number 2    Authorization - Number of Visits 12    PT Start Time 1130    PT Stop Time 1216    PT Time Calculation (min) 46 min    Activity Tolerance Patient tolerated treatment well    Behavior During Therapy [facility] for tasks assessed/performed           History reviewed. No pertinent past medical history.  Past Surgical History:  Procedure Laterality Date  . CESAREAN SECTION    . TUBAL LIGATION    . tubal reversal      There were no vitals filed for this visit.   Subjective Assessment - 04/12/20 1130    Subjective "It's about a 5. I can move around and do stuff throughout the day, but it still just hurts with certain movements. Pt reports continued numbness and tingling in R 3rd and 4th digits that has been constant.    Limitations House hold activities   difficulty when using R arm but denies limitations otherwise   How long can you sit comfortably? Denies limitations    How long can you stand comfortably? Denies limitations    How long can you walk comfortably? Denies limitations    Patient Stated Goals Bowling and not having pain    Currently in Pain? Yes    Pain Score 5     Pain Location Shoulder    Pain Orientation Right;Anterior   top and front of shoulder   Pain Descriptors / Indicators Dull;Sharp;Spasm    Pain Type Acute pain    Pain Onset More than a month ago              [facility] PT  Assessment - 04/12/20 0001      Assessment   Medical Diagnosis Cervical radiculopathy; cervical strain; R RTC strain    Referring Provider (PT) Pati Gallo, MD    Onset Date/Surgical Date 02/07/20                         [facility] Adult PT Treatment/Exercise - 04/12/20 0001      Self-Care   Self-Care Other Self-Care Comments    Other Self-Care Comments  See patient education      Neck Exercises: Machines for Strengthening   UBE (Upper Arm Bike) L1.5 x 6 min (3 min forward, 3 min backward)      Neck Exercises: Supine   Neck Retraction 20 reps    Neck Retraction Limitations supine into pillow      Shoulder Exercises: Supine   Horizontal ABduction Strengthening;Right;15 reps;Theraband    Theraband Level (Shoulder Horizontal ABduction) Level 2 (Red)                  PT Education - 04/12/20 1402    Education Details Updated and reviewed HEP. Advised patient to use modalities for pain relief as able/needed ~  15 minutes. Skeleton model to provide education regarding anatomy of cervical spine and ulnar groove    Person(s) Educated Patient    Methods Explanation;Demonstration;Tactile cues;Verbal cues;Handout    Comprehension Verbalized understanding;Returned demonstration            PT Short Term Goals - 04/12/20 1408      PT SHORT TERM GOAL #1   Title Pt will be independent with initial HEP    Baseline Provided initial HEP at evaluation 03/18/2020    Time 3    Period Weeks    Status On-going    Target Date 04/09/20      PT SHORT TERM GOAL #2   Title Pt will be able to lift and hold 5# dumbbell with </= 4/10 pain in R shoulder.    Baseline 5/10 pain at rest during eval and reports of pain when lifting a 2 liter (~ 4 lbs)    Time 3    Period Weeks    Status On-going    Target Date 04/09/20      PT SHORT TERM GOAL #3   Title Pt will report minimal to no radicular symptoms in R 3rd and 4th digits throughout the day.    Baseline Pt reports constant N/T  in R 3rd and 4th digits    Time 3    Period Weeks    Status On-going    Target Date 04/09/20      PT SHORT TERM GOAL #4   Title Pt will increase cervical L sidebending AROM to at least 45 degrees with </= 3/10 R shoulder or neck pain.    Baseline L SB 34 degrees; R SB 58 degrees    Time 3    Period Weeks    Status On-going    Target Date 04/09/20             PT Long Term Goals - 03/19/20 2152      PT LONG TERM GOAL #1   Title Pt will be independent with advanced HEP.    Baseline Provided initial HEP at evaluation 03/18/2020    Time 6    Period Weeks    Status New    Target Date 04/30/20      PT LONG TERM GOAL #2   Title Pt will be able to lift and hold 8# dumbbell with </= 2/10 pain in R shoulder.    Baseline 5/10 pain at rest during eval and reports of pain when lifting a 2 liter (~ 4 lbs)    Time 6    Period Weeks    Status New    Target Date 04/30/20      PT LONG TERM GOAL #3   Title Pt will demonstrate R shoulder AROM WFL in all planes with </= 2/10 pain or discomfort.    Baseline See flowsheet    Time 6    Period Weeks    Status New    Target Date 04/30/20      PT LONG TERM GOAL #4   Title Pt will increase R shoulder MMT grossly to at least 4+/5    Baseline See flowsheet    Time 6    Period Weeks    Status New    Target Date 04/30/20      PT LONG TERM GOAL #5   Title Pt will report being able to return to recreational bowling with minimal to no R shoulder or neck pain.    Baseline Pt currently unable to bowl  as desired and has not attempted due to her pain.    Time 6    Period Weeks    Status New    Target Date 04/30/20                 Plan - 04/12/20 1136    Clinical Impression Statement Patient had fair tolerance with treatment session but experienced continued radicular symptoms along R ulnar distribution. She expressed "more just tingling than numbness" during cervical distraction in sitting and supine. She had increased symptoms during  ULT33 with increased aggravation during wrist EXT (shoulder depression/ABD, elbow FL, forearm pronation, wrist EXT). Reiterated postural awareness and control this session. She presents with TTP along distal clavicle and expresses "spasms" during gentle inferior mobilizations at [facility] joint. She should benefit from continued skilled PT intervention for pain reduction and return to PLOF.    Personal Factors and Comorbidities Fitness    Examination-Activity Limitations Carry;Sleep;Lift    Examination-Participation Restrictions Cleaning;Community Activity;Meal Prep;Shop;Occupation    Stability/Clinical Decision Making Evolving/Moderate complexity    Rehab Potential Good    PT Frequency 1x / week    PT Duration 6 weeks    PT Treatment/Interventions ADLs/Self Care Home Management;Cryotherapy;Electrical Stimulation;Iontophoresis 4mg /ml Dexamethasone;Moist Heat;Traction;Neuromuscular re-education;Therapeutic activities;Functional mobility training;Stair training;Gait training;Patient/family education;Manual techniques;Energy conservation;Dry needling;Passive range of motion;Taping    PT Next Visit Plan Grip strength (dynamometer), cervical retraction, ulnar nerve glides, STM as needed, periscapular/RTC strengthening as tolerated. Postural awareness and control    PT Home Exercise Plan [id number] - upper trap and levator scap stretches, scapular retraction, doorway pec stretch    Consulted and Agree with Plan of Care Patient           Patient will benefit from skilled therapeutic intervention in order to improve the following deficits and impairments:  Decreased mobility,Decreased strength,Impaired sensation,Postural dysfunction,Improper body mechanics,Decreased endurance,Decreased range of motion,Increased muscle spasms,Impaired UE functional use,Pain,Decreased activity tolerance  Visit Diagnosis: Cervicalgia  Radiculopathy, cervical region  Acute pain of right shoulder  Stiffness of right shoulder,  not elsewhere classified  Muscle weakness (generalized)  Other muscle spasm     Problem List There are no problems to display for this patient.   04/12/2020, 2:11 PM  [facility] [address] [place], [place], [place] Phone: [phone]   Fax:  [phone]  Name: [phone] Bialecki MRN: Theresa Date of Birth: Feb 20, 1985

## 2020-04-12 NOTE — Therapy (Signed)
[facility] Outpatient Rehabilitation [facility] [address] [place], [place], [address] Phone: [phone]   Fax:  [phone]  Physical Therapy Treatment  Patient Details  Name: Theresa Holt MRN: [id number] Date of Birth: 1986/02/05 Referring Provider (PT): Pati Gallo, MD   Encounter Date: 04/12/2020   PT End of Session - 04/12/20 1135    Visit Number 2    Number of Visits 7    Date for PT Re-Evaluation 05/01/20    Authorization Type [place] [employer]    Authorization Time Period no auth required for initial 12 visits    Authorization - Visit Number 2    Authorization - Number of Visits 12    PT Start Time 1130    PT Stop Time 1216    PT Time Calculation (min) 46 min    Activity Tolerance Patient tolerated treatment well    Behavior During Therapy [facility] for tasks assessed/performed           History reviewed. No pertinent past medical history.  Past Surgical History:  Procedure Laterality Date  . CESAREAN SECTION    . TUBAL LIGATION    . tubal reversal      There were no vitals filed for this visit.   Subjective Assessment - 04/12/20 1130    Subjective "It's about a 5. I can move around and do stuff throughout the day, but it still just hurts with certain movements. Pt reports continued numbness and tingling in R 3rd and 4th digits that has been constant.    Limitations House hold activities   difficulty when using R arm but denies limitations otherwise   How long can you sit comfortably? Denies limitations    How long can you stand comfortably? Denies limitations    How long can you walk comfortably? Denies limitations    Patient Stated Goals Bowling and not having pain    Currently in Pain? Yes    Pain Score 5     Pain Location Shoulder    Pain Orientation Right;Anterior   top and front of shoulder   Pain Descriptors / Indicators Dull;Sharp;Spasm    Pain Type Acute pain    Pain Onset More than a month ago              [facility] PT  Assessment - 04/12/20 0001      Assessment   Medical Diagnosis Cervical radiculopathy; cervical strain; R RTC strain    Referring Provider (PT) Pati Gallo, MD    Onset Date/Surgical Date 02/07/20                         [facility] Adult PT Treatment/Exercise - 04/12/20 0001      Self-Care   Self-Care Other Self-Care Comments    Other Self-Care Comments  See patient education      Neck Exercises: Machines for Strengthening   UBE (Upper Arm Bike) L1.5 x 6 min (3 min forward, 3 min backward)      Neck Exercises: Seated   Neck Retraction 20 reps    Other Seated Exercise SNAG cervical rotation with pillowcase x 10 each direction      Neck Exercises: Supine   Neck Retraction 20 reps    Neck Retraction Limitations supine into pillow      Shoulder Exercises: Supine   Horizontal ABduction Strengthening;Right;15 reps;Theraband    Theraband Level (Shoulder Horizontal ABduction) Level 2 (Red)    External Rotation Strengthening;Both;20 reps;Theraband    Theraband Level (  Shoulder External Rotation) Level 2 (Red)      Shoulder Exercises: Standing   Row Strengthening;Both;20 reps;Theraband    Theraband Level (Shoulder Row) Level 2 (Red)      Manual Therapy   Manual Therapy Joint mobilization;Soft tissue mobilization;Myofascial release    Joint Mobilization Upglides/downglides along cervical spine grades I-IV within tolerance. Gentle inferior AC joint mobs    Soft tissue mobilization STM and MFR along R shoulder musculature, pec minor, upper traps. STM around distal clavicle to tolerance                  PT Education - 04/12/20 1402    Education Details Updated and reviewed HEP. Advised patient to use modalities for pain relief as able/needed ~15 minutes. Skeleton model to provide education regarding anatomy of cervical spine and ulnar groove. Tennis ball for self STM/MFR    Person(s) Educated Patient    Methods Explanation;Demonstration;Tactile cues;Verbal  cues;Handout    Comprehension Verbalized understanding;Returned demonstration            PT Short Term Goals - 04/12/20 1408      PT SHORT TERM GOAL #1   Title Pt will be independent with initial HEP    Baseline Provided initial HEP at evaluation 03/18/2020    Time 3    Period Weeks    Status On-going    Target Date 04/09/20      PT SHORT TERM GOAL #2   Title Pt will be able to lift and hold 5# dumbbell with </= 4/10 pain in R shoulder.    Baseline 5/10 pain at rest during eval and reports of pain when lifting a 2 liter (~ 4 lbs)    Time 3    Period Weeks    Status On-going    Target Date 04/09/20      PT SHORT TERM GOAL #3   Title Pt will report minimal to no radicular symptoms in R 3rd and 4th digits throughout the day.    Baseline Pt reports constant N/T in R 3rd and 4th digits    Time 3    Period Weeks    Status On-going    Target Date 04/09/20      PT SHORT TERM GOAL #4   Title Pt will increase cervical L sidebending AROM to at least 45 degrees with </= 3/10 R shoulder or neck pain.    Baseline L SB 34 degrees; R SB 58 degrees    Time 3    Period Weeks    Status On-going    Target Date 04/09/20             PT Long Term Goals - 03/19/20 2152      PT LONG TERM GOAL #1   Title Pt will be independent with advanced HEP.    Baseline Provided initial HEP at evaluation 03/18/2020    Time 6    Period Weeks    Status New    Target Date 04/30/20      PT LONG TERM GOAL #2   Title Pt will be able to lift and hold 8# dumbbell with </= 2/10 pain in R shoulder.    Baseline 5/10 pain at rest during eval and reports of pain when lifting a 2 liter (~ 4 lbs)    Time 6    Period Weeks    Status New    Target Date 04/30/20      PT LONG TERM GOAL #3   Title Pt will demonstrate  R shoulder AROM WFL in all planes with </= 2/10 pain or discomfort.    Baseline See flowsheet    Time 6    Period Weeks    Status New    Target Date 04/30/20      PT LONG TERM GOAL #4    Title Pt will increase R shoulder MMT grossly to at least 4+/5    Baseline See flowsheet    Time 6    Period Weeks    Status New    Target Date 04/30/20      PT LONG TERM GOAL #5   Title Pt will report being able to return to recreational bowling with minimal to no R shoulder or neck pain.    Baseline Pt currently unable to bowl as desired and has not attempted due to her pain.    Time 6    Period Weeks    Status New    Target Date 04/30/20                 Plan - 04/12/20 1136    Clinical Impression Statement Patient had fair tolerance with treatment session but experienced continued radicular symptoms along R ulnar distribution. She expressed "more just tingling than numbness" during cervical distraction in sitting and supine. She had increased symptoms during ULT33 with increased aggravation during wrist EXT (shoulder depression/ABD, elbow FL, forearm pronation, wrist EXT). Reiterated postural awareness and control this session. She presents with TTP along distal clavicle and expresses "spasms" during gentle inferior mobilizations at [facility] joint. She should benefit from continued skilled PT intervention for pain reduction and return to PLOF.    Personal Factors and Comorbidities Fitness    Examination-Activity Limitations Carry;Sleep;Lift    Examination-Participation Restrictions Cleaning;Community Activity;Meal Prep;Shop;Occupation    Stability/Clinical Decision Making Evolving/Moderate complexity    Rehab Potential Good    PT Frequency 1x / week    PT Duration 6 weeks    PT Treatment/Interventions ADLs/Self Care Home Management;Cryotherapy;Electrical Stimulation;Iontophoresis 4mg /ml Dexamethasone;Moist Heat;Traction;Neuromuscular re-education;Therapeutic activities;Functional mobility training;Stair training;Gait training;Patient/family education;Manual techniques;Energy conservation;Dry needling;Passive range of motion;Taping    PT Next Visit Plan Grip strength (dynamometer),  cervical retraction, ulnar nerve glides, STM as needed, periscapular/RTC strengthening as tolerated. Postural awareness and control    PT Home Exercise Plan [id number] - upper trap and levator scap stretches, scapular retraction, doorway pec stretch    Consulted and Agree with Plan of Care Patient           Patient will benefit from skilled therapeutic intervention in order to improve the following deficits and impairments:  Decreased mobility,Decreased strength,Impaired sensation,Postural dysfunction,Improper body mechanics,Decreased endurance,Decreased range of motion,Increased muscle spasms,Impaired UE functional use,Pain,Decreased activity tolerance  Visit Diagnosis: Cervicalgia  Radiculopathy, cervical region  Acute pain of right shoulder  Stiffness of right shoulder, not elsewhere classified  Muscle weakness (generalized)  Other muscle spasm     Problem List There are no problems to display for this patient.    , PT, DPT 04/12/20 2:16 PM  [facility] Outpatient Rehabilitation [facility] [address] [place], [place], [place] Phone: [phone]   Fax:  [phone]  Name: [phone] Larkey MRN: Theresa Date of Birth: [birth year]

## 2020-04-19 ENCOUNTER — Ambulatory Visit: Payer: Medicaid Other

## 2020-04-19 ENCOUNTER — Other Ambulatory Visit: Payer: Self-pay

## 2020-04-19 DIAGNOSIS — M25511 Pain in right shoulder: Secondary | ICD-10-CM

## 2020-04-19 DIAGNOSIS — M542 Cervicalgia: Secondary | ICD-10-CM | POA: Diagnosis not present

## 2020-04-19 DIAGNOSIS — M62838 Other muscle spasm: Secondary | ICD-10-CM

## 2020-04-19 DIAGNOSIS — M25611 Stiffness of right shoulder, not elsewhere classified: Secondary | ICD-10-CM

## 2020-04-19 DIAGNOSIS — M6281 Muscle weakness (generalized): Secondary | ICD-10-CM

## 2020-04-19 DIAGNOSIS — M5412 Radiculopathy, cervical region: Secondary | ICD-10-CM

## 2020-04-19 NOTE — Therapy (Signed)
[facility] [address] [place], [place], [address] Phone: [phone]   Fax:  [phone]  Physical Therapy Treatment  Patient Details  Name: Theresa Holt MRN: [id number] Date of Birth: [birth year] Referring Provider (PT): Pati Gallo, MD   Encounter Date: 04/19/2020   PT End of Session - 04/19/20 1047    Visit Number 3    Number of Visits 7    Date for PT Re-Evaluation 05/01/20    Authorization Type [place] [employer]    Authorization Time Period no auth required for initial 12 visits    Authorization - Visit Number 3    Authorization - Number of Visits 12    PT Start Time 1047   pt arrived late   PT Stop Time 1131    PT Time Calculation (min) 44 min    Activity Tolerance Patient tolerated treatment well    Behavior During Therapy [facility] for tasks assessed/performed           History reviewed. No pertinent past medical history.  Past Surgical History:  Procedure Laterality Date  . CESAREAN SECTION    . TUBAL LIGATION    . tubal reversal      There were no vitals filed for this visit.   Subjective Assessment - 04/19/20 1047    Subjective "It's about a 5. It stays pretty constant." Pt reports no change in her symptoms thus far with constant R shoulder pain regardless of performing HEP or positioning. Pt reports previously discussing with physician regarding potential MRI after formal PT trial if no improvement is noted.    Limitations House hold activities   difficulty when using R arm but denies limitations otherwise   How long can you sit comfortably? Denies limitations    How long can you stand comfortably? Denies limitations    How long can you walk comfortably? Denies limitations    Patient Stated Goals Bowling and not having pain    Currently in Pain? Yes    Pain Score 5     Pain Location Shoulder    Pain Orientation Right;Anterior   top and front of R shoulder   Pain Descriptors / Indicators  Dull;Spasm;Sharp    Pain Type Acute pain    Pain Radiating Towards constant N/T in right 3rd and 4th digits    Pain Onset More than a month ago              [facility] PT Assessment - 04/19/20 0001      Assessment   Medical Diagnosis Cervical radiculopathy; cervical strain; R RTC strain    Referring Provider (PT) Pati Gallo, MD    Onset Date/Surgical Date 02/07/20                         [facility] Adult PT Treatment/Exercise - 04/19/20 0001      Self-Care   Self-Care Other Self-Care Comments    Other Self-Care Comments  See patient education      Neck Exercises: Machines for Strengthening   UBE (Upper Arm Bike) L2 x 6 min (3 min forward, 3 min backward)      Neck Exercises: Supine   Neck Retraction 20 reps    Neck Retraction Limitations supine into pillow; cues for technique      Shoulder Exercises: Supine   Horizontal ABduction Strengthening;Right;20 reps;Theraband    Theraband Level (Shoulder Horizontal ABduction) Level 2 (Red)      Shoulder Exercises: Standing   Extension  Strengthening;Both;20 reps;Theraband    Theraband Level (Shoulder Extension) Level 3 (Green)    Row Strengthening;Both;20 reps;Theraband    Theraband Level (Shoulder Row) Level 3 (Green)      Shoulder Exercises: Stretch   Corner Stretch 1 rep;60 seconds      Manual Therapy   Joint Mobilization Upglides/downglides along cervical spine grades I-IV within tolerance. Gentle inferior AC joint and 1st rib mobs to tolerance    Soft tissue mobilization STM and MFR along R shoulder musculature, pec minor, upper traps, levator scap. STM around distal clavicle to tolerance                  PT Education - 04/19/20 1053    Education Details Reviewed HEP. Discussed time for healing and increased muscle guarding following trauma and reiterated continued compliance with HEP    Person(s) Educated Patient    Methods Explanation;Demonstration;Tactile cues;Verbal cues    Comprehension Verbalized  understanding;Returned demonstration            PT Short Term Goals - 04/12/20 1408      PT SHORT TERM GOAL #1   Title Pt will be independent with initial HEP    Baseline Provided initial HEP at evaluation 03/18/2020    Time 3    Period Weeks    Status On-going    Target Date 04/09/20      PT SHORT TERM GOAL #2   Title Pt will be able to lift and hold 5# dumbbell with </= 4/10 pain in R shoulder.    Baseline 5/10 pain at rest during eval and reports of pain when lifting a 2 liter (~ 4 lbs)    Time 3    Period Weeks    Status On-going    Target Date 04/09/20      PT SHORT TERM GOAL #3   Title Pt will report minimal to no radicular symptoms in R 3rd and 4th digits throughout the day.    Baseline Pt reports constant N/T in R 3rd and 4th digits    Time 3    Period Weeks    Status On-going    Target Date 04/09/20      PT SHORT TERM GOAL #4   Title Pt will increase cervical L sidebending AROM to at least 45 degrees with </= 3/10 R shoulder or neck pain.    Baseline L SB 34 degrees; R SB 58 degrees    Time 3    Period Weeks    Status On-going    Target Date 04/09/20             PT Long Term Goals - 03/19/20 2152      PT LONG TERM GOAL #1   Title Pt will be independent with advanced HEP.    Baseline Provided initial HEP at evaluation 03/18/2020    Time 6    Period Weeks    Status New    Target Date 04/30/20      PT LONG TERM GOAL #2   Title Pt will be able to lift and hold 8# dumbbell with </= 2/10 pain in R shoulder.    Baseline 5/10 pain at rest during eval and reports of pain when lifting a 2 liter (~ 4 lbs)    Time 6    Period Weeks    Status New    Target Date 04/30/20      PT LONG TERM GOAL #3   Title Pt will demonstrate R shoulder AROM WFL in  all planes with </= 2/10 pain or discomfort.    Baseline See flowsheet    Time 6    Period Weeks    Status New    Target Date 04/30/20      PT LONG TERM GOAL #4   Title Pt will increase R shoulder MMT grossly  to at least 4+/5    Baseline See flowsheet    Time 6    Period Weeks    Status New    Target Date 04/30/20      PT LONG TERM GOAL #5   Title Pt will report being able to return to recreational bowling with minimal to no R shoulder or neck pain.    Baseline Pt currently unable to bowl as desired and has not attempted due to her pain.    Time 6    Period Weeks    Status New    Target Date 04/30/20                 Plan - 04/19/20 1053    Clinical Impression Statement Patient tolerated PT session well with no adverse effects or complaints of significant increase in pain though she has continued 5/10 R shoulder pain and N/T in R 3rd and 4th digits regardless of interventions or manual techniques. She expresses that she has been performing HEP consistently with no change in her symptoms. She continues to have tenderness and guarding along R shoulder musculature, pec minor, and upper trap with TTP along distal AC joint. Pt is eager to have improvement in her R shoulder due to hoping to begin working at a new job with [employer] next week if able, but she is currently limited by constant pain. She continues to have pain when lifting and carrying 2 liter bottle. Pt plans to reach out to physician for a follow up appointment for potential further assessment.    Personal Factors and Comorbidities Fitness    Examination-Activity Limitations Carry;Sleep;Lift    Examination-Participation Restrictions Cleaning;Community Activity;Meal Prep;Shop;Occupation    Stability/Clinical Decision Making Evolving/Moderate complexity    Rehab Potential Good    PT Frequency 1x / week    PT Duration 6 weeks    PT Treatment/Interventions ADLs/Self Care Home Management;Cryotherapy;Electrical Stimulation;Iontophoresis 4mg /ml Dexamethasone;Moist Heat;Traction;Neuromuscular re-education;Therapeutic activities;Functional mobility training;Stair training;Gait training;Patient/family education;Manual techniques;Energy  conservation;Dry needling;Passive range of motion;Taping    PT Next Visit Plan Manual techniques PRN, periscapular/RTC strengthening as tolerated. Postural awareness and control    PT Home Exercise Plan [id number]    Consulted and Agree with Plan of Care Patient           Patient will benefit from skilled therapeutic intervention in order to improve the following deficits and impairments:  Decreased mobility,Decreased strength,Impaired sensation,Postural dysfunction,Improper body mechanics,Decreased endurance,Decreased range of motion,Increased muscle spasms,Impaired UE functional use,Pain,Decreased activity tolerance  Visit Diagnosis: Cervicalgia  Radiculopathy, cervical region  Acute pain of right shoulder  Stiffness of right shoulder, not elsewhere classified  Muscle weakness (generalized)  Other muscle spasm     Problem List There are no problems to display for this patient.    , PT, DPT 04/19/20 11:53 AM  [facility] [address] [place], [place], [place] Phone: [phone]   Fax:  [phone]  Name: Theresa Holt MRN: Theresa Date of Birth: [birth year]

## 2020-04-26 ENCOUNTER — Other Ambulatory Visit: Payer: Self-pay

## 2020-04-26 ENCOUNTER — Ambulatory Visit: Payer: Medicaid Other

## 2020-04-26 DIAGNOSIS — M62838 Other muscle spasm: Secondary | ICD-10-CM

## 2020-04-26 DIAGNOSIS — M25611 Stiffness of right shoulder, not elsewhere classified: Secondary | ICD-10-CM

## 2020-04-26 DIAGNOSIS — M6281 Muscle weakness (generalized): Secondary | ICD-10-CM

## 2020-04-26 DIAGNOSIS — M25511 Pain in right shoulder: Secondary | ICD-10-CM

## 2020-04-26 DIAGNOSIS — M542 Cervicalgia: Secondary | ICD-10-CM | POA: Diagnosis not present

## 2020-04-26 DIAGNOSIS — M5412 Radiculopathy, cervical region: Secondary | ICD-10-CM

## 2020-04-26 NOTE — Therapy (Addendum)
[place], [place], [address] Phone: [phone]   Fax:  [phone]  Physical Therapy Treatment / Discharge  Patient Details  Name: Theresa Holt MRN: [id number] Date of Birth: 06-22-1985 Referring Provider (PT): Berle Mull, MD   Encounter Date: 04/26/2020   PT End of Session - 04/26/20 1051    Visit Number 4    Number of Visits 16    Date for PT Re-Evaluation 06/12/20    Authorization Type [place] [employer]    Authorization Time Period no auth required for initial 12 visits    Authorization - Visit Number 4    Authorization - Number of Visits 12    PT Start Time 3151    PT Stop Time 1132    PT Time Calculation (min) 45 min    Activity Tolerance Patient tolerated treatment well    Behavior During Therapy [facility] for tasks assessed/performed           History reviewed. No pertinent past medical history.  Past Surgical History:  Procedure Laterality Date  . CESAREAN SECTION    . TUBAL LIGATION    . tubal reversal      There were no vitals filed for this visit.   Subjective Assessment - 04/26/20 1052    Subjective Pt reports continued 5/10 R shoulder pain with no change. She states she has a follow up appointment with physician for further assessment and potential imaging tomorrow.    Limitations House hold activities   difficulty when using R arm but denies limitations otherwise   How long can you sit comfortably? Denies limitations    How long can you stand comfortably? Denies limitations    How long can you walk comfortably? Denies limitations    Patient Stated Goals Bowling and not having pain    Currently in Pain? Yes    Pain Score 5     Pain Location Shoulder    Pain Orientation Right;Anterior   top and front of R shoulder   Pain Descriptors / Indicators Dull;Sharp;Spasm    Pain Onset More than a month ago              [facility] PT Assessment - 04/26/20 0001      Assessment    Medical Diagnosis Cervical radiculopathy; cervical strain; R RTC strain    Referring Provider (PT) Berle Mull, MD    Onset Date/Surgical Date 02/07/20      AROM   Right Shoulder Flexion 140 Degrees    Right Shoulder ABduction 129 Degrees    Right Shoulder Internal Rotation --   L4   Right Shoulder External Rotation --   T3   Left Shoulder Flexion 160 Degrees    Left Shoulder ABduction 150 Degrees    Left Shoulder Internal Rotation --   L2   Left Shoulder External Rotation --   T4   Cervical Flexion 54    Cervical Extension 55   minor pulling in R upper trap   Cervical - Right Side Bend 56    Cervical - Left Side Bend 48   discomfort in R upper trap   Cervical - Right Rotation 58   "pulling" in R upper trap   Cervical - Left Rotation 62      Strength   Overall Strength Comments Pain with resisted R shoulder FL and ABD, and overhead motion    Right Shoulder Flexion 4-/5    Right Shoulder ABduction 4-/5    Right Shoulder  Internal Rotation 4/5    Right Shoulder External Rotation 4/5    Left Shoulder Flexion 4/5    Left Shoulder ABduction 4/5    Left Shoulder Internal Rotation 4/5    Left Shoulder External Rotation 4/5    Right Elbow Flexion 4+/5    Right Elbow Extension 4+/5    Left Elbow Flexion 4+/5    Left Elbow Extension 4+/5      Palpation   Palpation comment TTP along R upper trap, R pec minor, and at R olecranon                         [facility] Adult PT Treatment/Exercise - 04/26/20 0001      Self-Care   Self-Care Other Self-Care Comments    Other Self-Care Comments  See patient education      Elbow Exercises   Elbow Flexion Strengthening;Right;15 reps;Standing   supination and flexion; attempted with dumbbell but difficulty grasping and experienced minor pain at right olecranon that was not present without weight. cues for bicep contraction and 2-3 sec hold at end range flexion   Elbow Extension Strengthening;Right;15 reps;Standing   tricep kickback  with cues for tricep activation and hold at end range extension     Neck Exercises: Machines for Strengthening   UBE (Upper Arm Bike) L2.5 x 6 min (3 min forward, 3 min backward)      Neck Exercises: Seated   Neck Retraction 20 reps      Shoulder Exercises: Seated   Retraction Strengthening;Both;20 reps      Shoulder Exercises: Standing   Row Strengthening;Both;20 reps;Theraband    Theraband Level (Shoulder Row) Level 2 (Red)   difficulty gripping theraband handle due to bilateral hand weakness - wrapped band around hands in order to perform   Other Standing Exercises serratus wall slides x 20      Hand Exercises   Other Hand Exercises putty squeezes x 20 each hand      Manual Therapy   Manual Therapy Joint mobilization;Soft tissue mobilization    Joint Mobilization grades I-IV mobilizations in various ranges of right elbow motion - humeroulnar and radioulnar mobilizations    Soft tissue mobilization STM along right pec minor      Neck Exercises: Stretches   Upper Trapezius Stretch Right;1 rep;Other (comment)   90 seconds   Corner Stretch 1 rep;Other (comment)   90 seconds; R doorway stretch                 PT Education - 04/26/20 1332    Education Details Updated and reviewed HEP, objective findings, POC. Pt returns for follow up and potential imaging tomorrow per patient due to continued R shoulder pain without change since onset of symptoms. Reviewed elbow anatomy and ulnar nerve anatomy (TTP at R olecranon and ulnar groove).    Person(s) Educated Patient    Methods Explanation;Demonstration    Comprehension Verbalized understanding;Returned demonstration            PT Short Term Goals - 04/26/20 1342      PT SHORT TERM GOAL #1   Title Pt will be independent with initial HEP    Baseline Provided initial HEP at evaluation 03/18/2020    Time 3    Period Weeks    Status On-going    Target Date 04/09/20      PT SHORT TERM GOAL #2   Title Pt will be able to  lift and hold 5# dumbbell with </=  4/10 pain in R shoulder.    Baseline 5/10 pain at rest during eval and reports of pain when lifting a 2 liter (~ 4 lbs)    Time 3    Period Weeks    Status On-going    Target Date 04/09/20      PT SHORT TERM GOAL #3   Title Pt will report minimal to no radicular symptoms in R 3rd and 4th digits throughout the day.    Baseline Pt reports constant N/T in R 3rd and 4th digits    Time 3    Period Weeks    Status On-going    Target Date 04/09/20      PT SHORT TERM GOAL #4   Title Pt will increase cervical L sidebending AROM to at least 45 degrees with </= 3/10 R shoulder or neck pain.    Baseline See flowsheet - improved motion but continued pain    Time 3    Period Weeks    Status Partially Met    Target Date 04/09/20             PT Long Term Goals - 04/26/20 1343      PT LONG TERM GOAL #1   Title Pt will be independent with advanced HEP.    Baseline Provided initial HEP at evaluation 03/18/2020    Time 6    Period Weeks    Status Achieved      PT LONG TERM GOAL #2   Title Pt will be able to lift and hold 8# dumbbell with </= 2/10 pain in R shoulder.    Baseline 5/10 pain at rest during eval and reports of pain when lifting a 2 liter (~ 4 lbs)    Time 6    Period Weeks    Status On-going      PT LONG TERM GOAL #3   Title Pt will demonstrate R shoulder AROM WFL in all planes with </= 2/10 pain or discomfort.    Baseline See flowsheet    Time 6    Period Weeks    Status On-going      PT LONG TERM GOAL #4   Title Pt will increase R shoulder MMT grossly to at least 4+/5    Baseline See flowsheet    Time 6    Period Weeks    Status On-going      PT LONG TERM GOAL #5   Title Pt will report being able to return to recreational bowling with minimal to no R shoulder or neck pain.    Baseline Pt currently unable to bowl as desired and has not attempted due to her pain.    Time 6    Period Weeks    Status On-going                  Plan - 04/26/20 1117    Clinical Impression Statement Patient's RUE AROM and MMT remains unchanged grossly with no significant change in pain or presentation. She continues to have numbness/tingling in R 3rd and 4th digits with some change in symptoms during right elbow joint mobilizations but no lasting relief. She has continued TTP along R upper trap, R pec minor, R distal AC joint, and R olecranon and along ulnar groove. She expresses having weakness in bilateral hands since her fall and was provided with yellow putty for grip strength. She reports having a follow up appointment for further assessment and potential imaging tomorrow. Will plan to continue skilled  PT but discussed adjusting POC pending imaging results if needed. She should benefit from skilled PT to continue postural, periscapular, and RUE strength and mobility as well as cervical mobility for overall improved function.    Personal Factors and Comorbidities Fitness    Examination-Activity Limitations Carry;Sleep;Lift    Examination-Participation Restrictions Cleaning;Community Activity;Meal Prep;Shop;Occupation    Stability/Clinical Decision Making Evolving/Moderate complexity    Rehab Potential Good    PT Frequency 1x / week    PT Duration 6 weeks    PT Treatment/Interventions ADLs/Self Care Home Management;Cryotherapy;Electrical Stimulation;Iontophoresis 27m/ml Dexamethasone;Moist Heat;Traction;Neuromuscular re-education;Therapeutic activities;Functional mobility training;Stair training;Gait training;Patient/family education;Manual techniques;Energy conservation;Dry needling;Passive range of motion;Taping    PT Next Visit Plan Manual techniques PRN, periscapular/RTC strengthening as tolerated. Postural awareness and control    PT Home Exercise Plan [id number]    Consulted and Agree with Plan of Care Patient           Patient will benefit from skilled therapeutic intervention in order to improve the following  deficits and impairments:  Decreased mobility,Decreased strength,Impaired sensation,Postural dysfunction,Improper body mechanics,Decreased endurance,Decreased range of motion,Increased muscle spasms,Impaired UE functional use,Pain,Decreased activity tolerance  Visit Diagnosis: Cervicalgia  Radiculopathy, cervical region  Acute pain of right shoulder  Stiffness of right shoulder, not elsewhere classified  Muscle weakness (generalized)  Other muscle spasm     Problem List There are no problems to display for this patient.   KHaydee Monica PT, DPT 04/26/20 1:45 PM  [id number] [address]Phone: [phone]  Fax:  [phone] Name: Theresa Holt MRN: [id number] of Birth: [birth year]  PHYSICAL THERAPY DISCHARGE SUMMARY  Visits from Start of Care: 4  Current functional level related to goals / functional outcomes: See above   Remaining deficits: See above   Education / Equipment: HEP Plan:                                                    Patient goals were not met. Patient is being discharged due to not returning since the last visit.  ?????  Discharge due to attendance policy.  CHilda Blades PT, DPT, LAT, ATC 05/18/20  12:05 PM Phone: [phone]: [phone]

## 2020-05-03 ENCOUNTER — Telehealth: Payer: Self-pay | Admitting: Physical Therapy

## 2020-05-03 ENCOUNTER — Ambulatory Visit: Payer: Medicaid Other | Admitting: Physical Therapy

## 2020-05-03 NOTE — Telephone Encounter (Signed)
Called patient regarding her missed appt today at 9:15.  Referred to cancellation, no show policy.  Reminded of her next appt and offered her to reschedule later today if she could (via voicemail).  Karie Mainland, PT 05/03/20 9:40 AM Phone: [phone] Fax: [phone]

## 2020-05-06 ENCOUNTER — Ambulatory Visit: Payer: Medicaid Other | Admitting: Physical Therapy

## 2020-05-12 ENCOUNTER — Ambulatory Visit: Payer: Medicaid Other | Admitting: Physical Therapy

## 2020-05-14 ENCOUNTER — Ambulatory Visit: Payer: Medicaid Other | Admitting: Physical Therapy

## 2020-05-14 ENCOUNTER — Telehealth: Payer: Self-pay | Admitting: Physical Therapy

## 2020-05-14 NOTE — Telephone Encounter (Signed)
Attempted to contact patient due to missed PT appointment. No answer and unable to leave VM.  Rosana Hoes, PT, DPT, LAT, ATC 05/14/20  11:22 AM Phone: [phone] Fax: [phone]

## 2020-05-18 ENCOUNTER — Ambulatory Visit: Payer: Medicaid Other | Admitting: Physical Therapy

## 2020-05-18 ENCOUNTER — Telehealth: Payer: Self-pay | Admitting: Physical Therapy

## 2020-05-18 ENCOUNTER — Ambulatory Visit: Payer: Medicaid Other | Attending: Sports Medicine | Admitting: Physical Therapy

## 2020-05-18 NOTE — Telephone Encounter (Signed)
Attempted to contact patient due to 3rd consecutive missed appointment. Patient informed of missed appointment (and dates of previous missed appointments) and that she will be discharged from PT due to attendance policy. Patient instructed she would need new PT referral prior to scheduling any future appointments.   Rosana Hoes, PT, DPT, LAT, ATC 05/18/20  12:27 PM Phone: [phone] Fax: [phone]

## 2020-05-20 ENCOUNTER — Ambulatory Visit: Payer: Medicaid Other | Admitting: Physical Therapy

## 2020-05-24 ENCOUNTER — Encounter: Payer: Medicaid Other | Admitting: Physical Therapy

## 2020-05-26 ENCOUNTER — Ambulatory Visit: Payer: Medicaid Other | Admitting: Physical Therapy

## 2020-05-31 ENCOUNTER — Encounter: Payer: Medicaid Other | Admitting: Physical Therapy

## 2020-06-02 ENCOUNTER — Encounter: Payer: Medicaid Other | Admitting: Physical Therapy

## 2020-06-07 ENCOUNTER — Encounter: Payer: Medicaid Other | Admitting: Physical Therapy

## 2020-06-09 ENCOUNTER — Encounter: Payer: Medicaid Other | Admitting: Physical Therapy

## 2020-09-10 ENCOUNTER — Ambulatory Visit: Payer: Medicaid Other | Attending: Family Medicine | Admitting: Physical Therapy

## 2020-09-28 ENCOUNTER — Emergency Department (HOSPITAL_COMMUNITY)
Admission: EM | Admit: 2020-09-28 | Discharge: 2020-09-28 | Disposition: A | Discharge status: Home / Self Care | Payer: Medicaid Other | Attending: Emergency Medicine | Admitting: Emergency Medicine

## 2020-09-28 ENCOUNTER — Encounter (HOSPITAL_COMMUNITY): Payer: Self-pay | Admitting: Emergency Medicine

## 2020-09-28 ENCOUNTER — Other Ambulatory Visit: Payer: Self-pay

## 2020-09-28 DIAGNOSIS — T162XXA Foreign body in left ear, initial encounter: Secondary | ICD-10-CM | POA: Diagnosis present

## 2020-09-28 DIAGNOSIS — X58XXXA Exposure to other specified factors, initial encounter: Secondary | ICD-10-CM | POA: Diagnosis not present

## 2020-09-28 NOTE — ED Triage Notes (Signed)
Patient has a Qtip stuck in left ear.

## 2020-09-28 NOTE — ED Provider Notes (Signed)
[facility] EMERGENCY DEPARTMENT Provider Note   CSN: [id number] Arrival date & time: 09/28/20  0229     History Chief Complaint  Patient presents with   Foreign Body in Ear    Theresa Holt is a 35 y.o. female presents with q-tip in the left ear onset 1 hour PTA.  Pt reports it is uncomfortable, but not painful. No bleeding or purulent drainage.  No changes in hearing. No fever or chills.   The history is provided by the patient and medical records. No language interpreter was used.      History reviewed. No pertinent past medical history.  There are no problems to display for this patient.   Past Surgical History:  Procedure Laterality Date   CESAREAN SECTION     TUBAL LIGATION     tubal reversal       OB History   No obstetric history on file.     History reviewed. No pertinent family history.  Social History   Tobacco Use   Smoking status: Never   Smokeless tobacco: Never  Substance Use Topics   Alcohol use: No   Drug use: No    Home Medications Prior to Admission medications   Medication Sig Start Date End Date Taking? Authorizing Provider  cephALEXin (KEFLEX) 500 MG capsule Take 1 capsule (500 mg total) by mouth 3 (three) times daily. Patient not taking: Reported on 03/18/2020 09/14/19   Devoria Albe, MD  diphenhydrAMINE (BENADRYL) 25 MG tablet Take 50 mg by mouth at bedtime as needed for itching or sleep. Patient not taking: Reported on 03/18/2020    [provider]  HYDROcodone-acetaminophen (NORCO/VICODIN) 5-325 MG tablet Take 1 tablet by mouth every 6 (six) hours as needed for moderate pain.    [provider]  ibuprofen (ADVIL,MOTRIN) 800 MG tablet Take 1 tablet (800 mg total) by mouth 3 (three) times daily. Patient not taking: Reported on 03/18/2020 11/23/16   Bethann Berkshire, MD  letrozole [facility]) 2.5 MG tablet Take 2.5 mg by mouth daily.    [provider]  metFORMIN (GLUCOPHAGE) 500 MG tablet Take 500 mg  by mouth 2 (two) times daily with a meal. Patient not taking: Reported on 03/18/2020    [provider]  methocarbamol (ROBAXIN) 500 MG tablet Take 500 mg by mouth 4 (four) times daily.    [provider]  predniSONE (DELTASONE) 10 MG tablet 6, 5, 4, 3, 2 then 1 tablet by mouth daily for 6 days total. Patient not taking: Reported on 03/18/2020 11/20/13   Burgess Amor, PA-C  PRESCRIPTION MEDICATION HCG shot once a month for fertility Patient not taking: Reported on 03/18/2020    [provider]  traMADol (ULTRAM) 50 MG tablet Take 1 tablet (50 mg total) by mouth every 6 (six) hours as needed. Patient not taking: Reported on 03/18/2020 11/23/16   Bethann Berkshire, MD    Allergies    Codeine  Review of Systems   Review of Systems  Constitutional:  Negative for chills and fever.  HENT:  Positive for ear pain. Negative for facial swelling.   Eyes:  Negative for visual disturbance.  Skin:  Negative for wound.  Neurological:  Negative for headaches.   Physical Exam Updated Vital Signs BP 117/76 (BP Location: Left Arm)   Pulse 79   Temp 98.5 F (36.9 C)   Resp 16   SpO2 99%   Physical Exam Vitals and nursing note reviewed.  Constitutional:      General: She  is not in acute distress.    Appearance: She is well-developed. She is not ill-appearing.  HENT:     Head: Normocephalic.     Right Ear: Tympanic membrane normal.     Left Ear: A foreign body is present. Left ear erythematous TM: cotton in the ear canal of the left ear.  Eyes:     General: No scleral icterus.    Conjunctiva/sclera: Conjunctivae normal.  Cardiovascular:     Rate and Rhythm: Normal rate.  Pulmonary:     Effort: Pulmonary effort is normal.  Abdominal:     General: There is no distension.  Musculoskeletal:        General: Normal range of motion.     Cervical back: Normal range of motion.  Skin:    General: Skin is warm and dry.  Neurological:     Mental Status: She is alert.   Psychiatric:        Mood and Affect: Mood normal.    ED Results / Procedures / Treatments    Procedures .Foreign Body Removal  Date/Time: 09/28/2020 3:38 AM Performed by: Dierdre Forth, PA-C Authorized by: Dierdre Forth, PA-C  Consent: Verbal consent obtained. Written consent not obtained. Risks and benefits: risks, benefits and alternatives were discussed Consent given by: patient Patient understanding: patient states understanding of the procedure being performed Patient consent: the patient's understanding of the procedure matches consent given Procedure consent: procedure consent matches procedure scheduled Relevant documents: relevant documents present and verified Test results: test results available and properly labeled Site marked: the operative site was marked Imaging studies: imaging studies available Required items: required blood products, implants, devices, and special equipment available Patient identity confirmed: verbally with patient and arm band Time out: Immediately prior to procedure a "time out" was called to verify the correct patient, procedure, equipment, support staff and site/side marked as required. Body area: ear Location details: left ear  Sedation: Patient sedated: no  Patient restrained: no Patient cooperative: yes Localization method: visualized Removal mechanism: alligator forceps Complexity: simple 1 objects recovered. Objects recovered: q-tip Post-procedure assessment: foreign body removed Patient tolerance: patient tolerated the procedure well with no immediate complications    Medications Ordered in ED Medications - No data to display  ED Course  I have reviewed the triage vital signs and the nursing notes.  Pertinent labs & imaging results that were available during my care of the patient were reviewed by me and considered in my medical decision making (see chart for details).    MDM Rules/Calculators/A&P                            Pt presents with foreign body in the left ear.  Removed without complication.  No injury to the TM or canal.  Discussed reasons to return.  Pt states understanding and is in agreement with the plan.   Final Clinical Impression(s) / ED Diagnoses Final diagnoses:  Foreign body of left ear, initial encounter    Rx / DC Orders ED Discharge Orders     None        Ryanna Teschner, Boyd Kerbs 09/28/20 0346    Tilden Fossa, MD 09/28/20 [phone]

## 2020-09-28 NOTE — Discharge Instructions (Addendum)
1. Medications: usual home medications 2. Treatment: rest, drink plenty of fluids, do not use q-tips 3. Follow Up: Please followup with your primary doctor in as needed days for discussion of your diagnoses and further evaluation after today's visit; if you do not have a primary care doctor use the resource guide provided to find one; Please return to the ER for as needed

## 2020-10-07 ENCOUNTER — Ambulatory Visit: Payer: Medicaid Other

## 2020-10-08 ENCOUNTER — Ambulatory Visit: Payer: Medicaid Other | Attending: Family Medicine

## 2020-10-08 ENCOUNTER — Other Ambulatory Visit: Payer: Self-pay

## 2020-10-08 DIAGNOSIS — M6281 Muscle weakness (generalized): Secondary | ICD-10-CM | POA: Diagnosis present

## 2020-10-08 DIAGNOSIS — G8929 Other chronic pain: Secondary | ICD-10-CM | POA: Insufficient documentation

## 2020-10-08 DIAGNOSIS — M545 Low back pain, unspecified: Secondary | ICD-10-CM | POA: Diagnosis present

## 2020-10-08 DIAGNOSIS — M25521 Pain in right elbow: Secondary | ICD-10-CM | POA: Diagnosis present

## 2020-10-08 DIAGNOSIS — M25562 Pain in left knee: Secondary | ICD-10-CM | POA: Diagnosis present

## 2020-10-08 DIAGNOSIS — M25561 Pain in right knee: Secondary | ICD-10-CM | POA: Diagnosis present

## 2020-10-08 DIAGNOSIS — M25522 Pain in left elbow: Secondary | ICD-10-CM | POA: Diagnosis present

## 2020-10-08 DIAGNOSIS — R2681 Unsteadiness on feet: Secondary | ICD-10-CM | POA: Diagnosis present

## 2020-10-08 NOTE — Patient Instructions (Signed)
  [id number]

## 2020-10-08 NOTE — Therapy (Signed)
[facility] [address] [place], [place], [address] Phone: [phone]   Fax:  [phone]  Physical Therapy Evaluation  Patient Details  Name: Theresa Holt MRN: [id number] Date of Birth: 03-17-1985 Referring Provider (PT): Ellender Hose, NP   Encounter Date: 10/08/2020   PT End of Session - 10/08/20 1018     Visit Number 1    Number of Visits 17    Date for PT Re-Evaluation 12/03/20    Authorization Type [employer]    Authorization Time Period Re-auth after initial 12 visits    Authorization - Visit Number 1    Authorization - Number of Visits 12    Progress Note Due on Visit 10    PT Start Time 0915    PT Stop Time 1000    PT Time Calculation (min) 45 min    Activity Tolerance Patient tolerated treatment well    Behavior During Therapy [facility] for tasks assessed/performed             History reviewed. No pertinent past medical history.  Past Surgical History:  Procedure Laterality Date   CESAREAN SECTION     TUBAL LIGATION     tubal reversal      There were no vitals filed for this visit.    Subjective Assessment - 10/08/20 0922     Subjective Pt reports with primary c/o BIL knee and elbow pain of insidous onset lasting about 1 year. She also reports LBP of insidous onset lasting 6-7 months. She denies any hx of injuries or surgeriesrelated to these pain presentations. Her previous doctor told her he thought it may be related to fibromyalgia, although this is not a definitive diagnosis at this point. Pt denies catching, popping, locking, or clicking related to her knee problems. She also denies any swelling, N/T, saddle anesthesia, nausea/ vomiting, unexplained weight loss/ gain, or change in bowel/ bladder function. The pt does report that her knee pain affects her sleep and, "when they get aching real bad, I don't get any sleep." The pt reports that standing increases her LBP and that bending/ squatting  increases her knee pain. Her knee pain is worse directly after after activity. Bending the elbows increase the elbow pain. Pt adds that she doesn't believe there is an association between her knee pain and LBP.    Limitations Sitting;Standing;Lifting;House hold activities    How long can you sit comfortably? 3 hours    How long can you stand comfortably? 1 hour    How long can you walk comfortably? 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 Yes    Pain Score 8     Pain Location Knee    Pain Orientation Right;Left;Anterior    Pain Descriptors / Indicators Aching    Pain Type Chronic pain    Pain Radiating Towards BIL calf/ thigh    Pain Onset More than a month ago    Pain Frequency Constant    Aggravating Factors  Prolonged sitting, standing    Pain Relieving Factors pain medication, laying down, heating pad    Effect of Pain on Daily Activities Unable to work, walk dog, exercise without limitation    Multiple Pain Sites Yes    Pain Score 4    Pain Location Elbow    Pain  Orientation Right;Left;Posterior    Pain Descriptors / Indicators Aching    Pain Type Chronic pain    Pain Onset More than a month ago    Pain Frequency Constant    Pain Score 3    Pain Location Back    Pain Orientation Lower    Pain Descriptors / Indicators Aching    Pain Type Chronic pain    Pain Onset More than a month ago    Pain Frequency Constant                [facility] PT Assessment - 10/08/20 0001       Assessment   Medical Diagnosis Low back pain, unspecified (M54.50)    Referring Provider (PT) Ellender Hose, NP    Onset Date/Surgical Date 10/09/19    Hand Dominance Right    Next MD Visit None scheduled    Prior Therapy Yes, for shoulder rehab      Precautions   Precautions None      Restrictions   Weight  Bearing Restrictions No      Balance Screen   Has the patient fallen in the past 6 months No    Has the patient had a decrease in activity level because of a fear of falling?  No    Is the patient reluctant to leave their home because of a fear of falling?  No      Home [employer] information centre manager residence    Living Arrangements Spouse/significant other;Children    Available Help at Discharge Family    Type of Home Apartment    Home Access Stairs to enter    Entrance Stairs-Number of Steps 1    Entrance Stairs-Rails None    Home Layout Two level    Alternate Level Stairs-Number of Steps 16    Alternate Level Stairs-Rails Right    Home Equipment None      Prior Function   Level of Independence Independent    Vocation Unemployed    Leisure Walking dog, shopping with daughter      Cognition   Overall Cognitive Status Within Functional Limits for tasks assessed      Functional Tests   Functional tests Squat;Single leg stance      Squat   Comments 100% depth with BIL knee pain (6/10)      Single Leg Stance   Comments x15 sec, 1 step error on L, 3 step errors on R with positive trendelenburg sign      AROM   Right Knee Extension -6    Right Knee Flexion 130   painful   Left Knee Extension -7    Left Knee Flexion 126   painful   Lumbar Flexion 100% with minor pain    Lumbar Extension 100% "feels good"    Lumbar - Right Side Bend 100% to knee    Lumbar - Left Side Bend 100% to knee    Lumbar - Right Rotation 100%    Lumbar - Left Rotation 100%      PROM   Right Knee Extension -6   painful   Right Knee Flexion 131   painful   Left Knee Extension -7   painful   Left Knee Flexion 130   painful     Strength   Right Hip Flexion 4+/5    Right Hip Extension 3+/5    Right Hip ABduction 3+/5    Left Hip Flexion 4+/5    Left Hip Extension 3+/5  Left Hip ABduction 3+/5    Right Knee Flexion 5/5    Right Knee Extension 5/5   "feels good"   Left Knee Flexion  5/5    Left Knee Extension 5/5   "feels good"     Flexibility   Soft Tissue Assessment /Muscle Length yes    Hamstrings hamstring 90/90 Test (+): 50d shy of full knee extension on R, 40d shy on L    Quadriceps (+) Ely's, moderate limitation BIL      Palpation   Patella mobility WNL BIL, "eases pain"    Palpation comment TTP to BIL patellar tendons      Special Tests    Special Tests Knee Special Tests    Knee Special tests  Lateral Pull Sign;Patellofemoral Apprehension Test;Patellofemoral Grind Test (Clarke's Sign)      Lateral Pull Sign    Findings Negative    Comments  BIL      Patellofemoral Apprehension Test    Findings Negative    Comments BIL      Patellofemoral Grind test (Clark's Sign)   Findings Negative    Comments BIL      other    Findings Negative    Comments Hoffa's fat pad test (BIL)      Transfers   Five time sit to stand comments  13sec                        Objective measurements completed on examination: See above findings.               PT Education - 10/08/20 1017     Education Details Pt educated on probable underlying pathophysiology causing her pain presentation, along with proper form with HEP.    Person(s) Educated Patient    Methods Explanation;Demonstration;Handout    Comprehension Verbalized understanding;Returned demonstration              PT Short Term Goals - 10/08/20 1050       PT SHORT TERM GOAL #1   Title Pt will report understanding and adherence to her HEP in order to promote independence in the management of her primary sxs.    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    Baseline Unable to stand longer than 1 hour due to knee pain    Time 8    Period Weeks    Status New    Target Date 12/03/20      PT LONG TERM GOAL #2   Title Pt will achieve WNL BIL quadriceps and hamstring extensibility in order to promote healthy knee mechanics during gait.    Baseline BIL quadriceps and hamstring moderately limited    Time  8    Period Weeks    Status New    Target Date 12/03/20      PT LONG TERM GOAL #3   Title Pt will achieve BIL global hip strength of 5/5 in order to progress independent exercise regimen without limitation.    Baseline BIL global hip strength ranging from 3+/5 to 4+/5    Time 8    Period Weeks    Status New    Target Date 12/03/20      PT LONG TERM GOAL #4   Title Pt will report no knee pain with BIL flexion AROM in order to resume walking her dog without limitation.    Baseline pain with BIL terminal knee flexion AROM    Time 8    Period Weeks    Status New    Target Date 12/03/20      PT LONG TERM GOAL #5   Title Pt will demonstrate ability to perform single leg stance x30 seconds with no step errors BIL in order to promote safe community ambulation.    Baseline 1 step error on L in 15 seconds, 3 step errors on R in 15 seconds    Time 8    Period Weeks    Status New    Target Date 12/03/20                    Plan - 10/08/20 1030     Clinical Impression Statement Pt is a pleasant 35yo F who presents with primary c/o chronic BIL knee pain, BIL elbow pain, and LBP. Due to time restraints, today's eval focused primarily on the pt's knee pain as this was the most pressing issue for the pt. Upon assessment, the pt's primary impairments include moderate limitations in BIL hamstring and quadriceps extensibility, LOB in single leg stance, exquisite TTP to BIL patellar tendons, pain with full depth squat, pain with BIL  end range knee flexion AROM and PROM, pain with BIL end range knee extension PROM, and weakness in BIL global hip musculature. Her sxs are most concordant with patellar tendonitis. Ruling out PFPS and patellar bursitis due to negative special testing for these conditions and patellar tendonitis being more likely. She will benefit from skilled PT to address her primary impairments and return to her prior level of function without limitation.    Examination-Activity Limitations Bend;Sit;Sleep;Squat;Stairs;Stand    Examination-Participation Restrictions Community Activity;Shop    Stability/Clinical Decision Making Evolving/Moderate complexity    Clinical Decision Making Moderate    Rehab Potential Good    PT Frequency 2x / week    PT Duration 8 weeks    PT Treatment/Interventions ADLs/Self Care Home Management;Aquatic Therapy;Electrical Stimulation;Cryotherapy;Traction;Moist Heat;Gait training;Stair training;Functional mobility training;Therapeutic activities;Therapeutic exercise;Balance training;Neuromuscular re-education;Manual techniques;Joint Manipulations;Spinal Manipulations;Dry needling;Passive range of motion;Compression bandaging;Patient/family education;Taping;Manual lymph drainage;Vasopneumatic Device    PT Next Visit Plan Assess elbow pain, further assess LBP, progress BIL LE strengthening, introduce aerobics as able    PT Home Exercise Plan [id number]    Consulted and Agree with Plan of Care Patient             Patient will benefit from skilled therapeutic intervention in order to improve the following deficits and impairments:  Difficulty walking, Pain, Impaired flexibility, Hypermobility, Decreased strength, Increased edema, Decreased balance  Visit Diagnosis: Chronic pain of left knee  Chronic pain of right knee  Pain in left elbow  Pain in right elbow  Chronic bilateral low back pain without sciatica  Muscle weakness (generalized)  Unsteadiness on feet     Problem  List There are no problems to display for this patient.   [facility] [place] [address] [place], [place], [address] Phone: [phone]   Fax:  [phone]  Name: Theresa Holt MRN: [id number] Date of Birth: 04-29-1985  Check all possible CPT codes: 97110- Therapeutic Exercise, [phone]- Neuro Re-education, [phone] - Gait Training, [phone] - Manual Therapy, [phone] - Therapeutic Activities, [phone] - Self Care, [phone] - Mechanical traction, 97014 - Electrical stimulation (unattended), [id number] - Delbert Harness, and [id number] - Aquatic therapy  Carmelina Dane, PT, DPT 10/08/20 12:07 PM

## 2020-10-15 ENCOUNTER — Other Ambulatory Visit: Payer: Self-pay

## 2020-10-15 ENCOUNTER — Emergency Department (HOSPITAL_COMMUNITY)
Admission: EM | Admit: 2020-10-15 | Discharge: 2020-10-16 | Discharge status: Against Medical Advice | Payer: Medicaid Other | Attending: Emergency Medicine | Admitting: Emergency Medicine

## 2020-10-15 DIAGNOSIS — R42 Dizziness and giddiness: Secondary | ICD-10-CM | POA: Diagnosis present

## 2020-10-15 DIAGNOSIS — Z5321 Procedure and treatment not carried out due to patient leaving prior to being seen by health care provider: Secondary | ICD-10-CM | POA: Insufficient documentation

## 2020-10-15 NOTE — ED Notes (Signed)
Called for triage x6

## 2020-10-20 ENCOUNTER — Ambulatory Visit: Payer: Medicaid Other | Admitting: Physical Therapy

## 2020-10-21 ENCOUNTER — Ambulatory Visit: Payer: Medicaid Other

## 2020-10-27 ENCOUNTER — Ambulatory Visit: Payer: Medicaid Other | Admitting: Physical Therapy

## 2020-10-27 ENCOUNTER — Encounter: Payer: Self-pay | Admitting: Physical Therapy

## 2020-10-27 ENCOUNTER — Other Ambulatory Visit: Payer: Self-pay

## 2020-10-27 DIAGNOSIS — G8929 Other chronic pain: Secondary | ICD-10-CM

## 2020-10-27 DIAGNOSIS — M545 Low back pain, unspecified: Secondary | ICD-10-CM

## 2020-10-27 DIAGNOSIS — M6281 Muscle weakness (generalized): Secondary | ICD-10-CM

## 2020-10-27 DIAGNOSIS — M25562 Pain in left knee: Secondary | ICD-10-CM | POA: Diagnosis not present

## 2020-10-27 DIAGNOSIS — M25522 Pain in left elbow: Secondary | ICD-10-CM

## 2020-10-27 DIAGNOSIS — M25521 Pain in right elbow: Secondary | ICD-10-CM

## 2020-10-27 NOTE — Patient Instructions (Signed)
Access Code: [email or web address] URL: [email or web address] Date: 10/27/2020 Prepared by: Jannette Spanner  Exercises Seated Hamstring Stretch - 1 x daily - 7 x weekly - 1 sets - 30sec x 3 hold Standing Quadriceps Stretch - 1 x daily - 7 x weekly - 1 sets - 3 reps - 30 hold Seated Long Arc Quad - 1 x daily - 7 x weekly - 2 sets - 10 reps Active Straight Leg Raise with Quad Set - 1 x daily - 7 x weekly - 2 sets - 10 reps Sidelying Hip Abduction - 1 x daily - 7 x weekly - 2 sets - 10 reps

## 2020-10-27 NOTE — Therapy (Signed)
[facility] [address] [place], [place], [address] Phone: [phone]   Fax:  [phone]  Physical Therapy Treatment  Patient Details  Name: Theresa Holt MRN: [id number] Date of Birth: 01-07-1986 Referring Provider (PT): Ellender Hose, NP   Encounter Date: 10/27/2020   PT End of Session - 10/27/20 1042     Visit Number 2    Number of Visits 17    Date for PT Re-Evaluation 12/03/20    Authorization Type [employer] MCD    Authorization Time Period Re-auth after initial 12 visits    Authorization - Visit Number 2    Authorization - Number of Visits 12    Progress Note Due on Visit 10             History reviewed. No pertinent past medical history.  Past Surgical History:  Procedure Laterality Date   CESAREAN SECTION     TUBAL LIGATION     tubal reversal      There were no vitals filed for this visit.   Subjective Assessment - 10/27/20 1019     Subjective Elbow 8/10 left, 7/10 on right elbow,  kness 5/10, back 5/10.                               [facility] Adult PT Treatment/Exercise - 10/27/20 0001       Knee/Hip Exercises: Stretches   Active Hamstring Stretch Limitations seated EOM 2 x 30 sec    Quad Stretch Limitations chair verses standing while pulling ankle to buttock -2 x 30 sec      Knee/Hip Exercises: Aerobic   Recumbent Bike L1 x 5 minutes      Knee/Hip Exercises: Seated   Long Arc Quad Limitations alternating AROM    Other Seated Knee/Hip Exercises review of isometric knee extension HEP- pt performing toe press into wall with leg exteneded.      Knee/Hip Exercises: Supine   Quad Sets 15 reps    Quad Sets Limitations 5 sec holds    Straight Leg Raises 10 reps;2 sets    Straight Leg Raises Limitations cues for abdominal draw in      Knee/Hip Exercises: Sidelying   Hip ABduction 10 reps;Right;Left    Hip ABduction Limitations 2 sets                     PT  Education - 10/27/20 1048     Education Details HEP    Person(s) Educated Patient    Methods Explanation;Handout    Comprehension Verbalized understanding              PT Short Term Goals - 10/08/20 1050       PT SHORT TERM GOAL #1   Title Pt will report understanding and adherence to her HEP in order to promote independence in the management of her primary sxs.    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    Baseline Unable to stand longer than 1 hour due to knee pain    Time 8    Period Weeks    Status New    Target Date 12/03/20      PT LONG TERM GOAL #2   Title Pt will achieve WNL BIL quadriceps and hamstring extensibility in order to promote healthy knee mechanics during gait.    Baseline BIL quadriceps and hamstring moderately limited    Time 8    Period Weeks    Status New    Target Date 12/03/20      PT LONG TERM GOAL #3   Title Pt will achieve BIL global hip strength of 5/5 in order to progress independent exercise regimen without limitation.    Baseline BIL global hip strength ranging from 3+/5 to 4+/5    Time 8    Period Weeks    Status New    Target Date 12/03/20      PT LONG TERM GOAL #4   Title Pt will report no knee pain with BIL flexion AROM in order to resume walking her dog without limitation.    Baseline pain with BIL terminal knee flexion AROM    Time 8    Period Weeks    Status New    Target Date 12/03/20      PT LONG TERM GOAL #5   Title Pt will demonstrate ability to perform single leg stance x30 seconds with  no step errors BIL in order to promote safe community ambulation.    Baseline 1 step error on L in 15 seconds, 3 step errors on R in 15 seconds    Time 8    Period Weeks    Status New    Target Date 12/03/20                   Plan - 10/27/20 1134     Clinical Impression Statement Pt reports compliance with HEP. Reviewed and modified knee HEP for comfort and progressed HEP with good tolerance. 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 She was encouraged to continue with HEP as able. Will further assess elbow and back at next visit.    PT Treatment/Interventions ADLs/Self Care Home Management;Aquatic Therapy;Electrical Stimulation;Cryotherapy;Traction;Moist Heat;Gait training;Stair training;Functional mobility training;Therapeutic activities;Therapeutic exercise;Balance training;Neuromuscular re-education;Manual techniques;Joint Manipulations;Spinal Manipulations;Dry needling;Passive range of motion;Compression bandaging;Patient/family education;Taping;Manual lymph drainage;Vasopneumatic Device    PT Next Visit Plan Assess elbow pain, further assess LBP, progress BIL LE strengthening, introduce aerobics as able    PT Home Exercise Plan [id number]    Consulted and Agree with Plan of Care Patient             Patient will benefit from skilled therapeutic intervention in order to improve the following deficits and impairments:  Difficulty walking, Pain, Impaired flexibility, Hypermobility, Decreased strength, Increased edema, Decreased balance  Visit Diagnosis: Chronic pain of left knee  Chronic pain of right knee  Pain in left elbow  Pain in right elbow  Chronic bilateral low back pain without sciatica  Muscle weakness (generalized)     Problem List There are no problems to display for this patient.  Sherrie Mustache, [place] 10/27/2020, 11:38 AM  [facility] [address] [place], [place], [address] Phone: [phone]   Fax:  [phone]  Name: Theresa Holt MRN: [id number] Date of Birth: 1986/01/18

## 2020-11-02 ENCOUNTER — Encounter: Payer: Medicaid Other | Admitting: Physical Therapy

## 2020-11-04 ENCOUNTER — Encounter: Payer: Medicaid Other | Admitting: Physical Therapy

## 2020-11-08 ENCOUNTER — Ambulatory Visit: Payer: Medicaid Other

## 2020-11-15 ENCOUNTER — Ambulatory Visit: Payer: Medicaid Other | Attending: Family Medicine

## 2020-11-15 ENCOUNTER — Other Ambulatory Visit: Payer: Self-pay

## 2020-11-15 DIAGNOSIS — M25562 Pain in left knee: Secondary | ICD-10-CM | POA: Diagnosis not present

## 2020-11-15 DIAGNOSIS — M6281 Muscle weakness (generalized): Secondary | ICD-10-CM | POA: Insufficient documentation

## 2020-11-15 DIAGNOSIS — G8929 Other chronic pain: Secondary | ICD-10-CM | POA: Diagnosis present

## 2020-11-15 DIAGNOSIS — R2681 Unsteadiness on feet: Secondary | ICD-10-CM | POA: Insufficient documentation

## 2020-11-15 DIAGNOSIS — M25561 Pain in right knee: Secondary | ICD-10-CM | POA: Diagnosis present

## 2020-11-15 DIAGNOSIS — M25521 Pain in right elbow: Secondary | ICD-10-CM | POA: Diagnosis present

## 2020-11-15 DIAGNOSIS — M545 Low back pain, unspecified: Secondary | ICD-10-CM | POA: Diagnosis present

## 2020-11-15 DIAGNOSIS — M25522 Pain in left elbow: Secondary | ICD-10-CM | POA: Insufficient documentation

## 2020-11-15 NOTE — Therapy (Addendum)
Theresa Holt, [place], [address] Phone: [phone]   Fax:  [phone]  Physical Therapy Treatment / Discharge  Patient Details  Name: Theresa Holt MRN: [id number] Date of Birth: September 28, 1985 Referring Provider (PT): Carylon Perches, NP   Encounter Date: 11/15/2020   PT End of Session - 11/15/20 1548     Visit Number 3    Number of Visits 17    Date for PT Re-Evaluation 12/03/20    Authorization Type [employer]    Authorization Time Period Re-auth after initial 12 visits    Authorization - Visit Number 3    Authorization - Number of Visits 12    Progress Note Due on Visit 10    PT Start Time 1535   Pt arrived 5 minutes late to her appointment   PT Stop Time 1615    PT Time Calculation (min) 40 min    Activity Tolerance Patient tolerated treatment well    Behavior During Therapy [facility] for tasks assessed/performed             History reviewed. No pertinent past medical history.  Past Surgical History:  Procedure Laterality Date   CESAREAN SECTION     TUBAL LIGATION     tubal reversal      There were no vitals filed for this visit.   Subjective Assessment - 11/15/20 1540     Subjective Pt reports continued pain since starting PT. She reports having a lot of family turmoil the past few weeks, which is why she has not been to PT. She adds that because of life circumstances, she has been unable to perform her HEP at this time.    Currently in Pain? Yes    Pain Score 5     Pain Location Knee    Pain Orientation Left;Right;Anterior    Pain Descriptors / Indicators Aching    Pain Type Chronic pain                               [facility] Adult PT Treatment/Exercise - 11/15/20 0001       Knee/Hip Exercises: Machines for Strengthening   Cybex Knee Extension eccentric (2 up, 1 down) 2x8 BIL    Cybex Leg Press 3x8 with 55#    Hip Cybex 2x10 hip abduction and extension with 25#       Knee/Hip Exercises: Standing   Lateral Step Up 3 sets;15 reps;Step Height: 6";Both                       PT Short Term Goals - 10/08/20 1050       PT SHORT TERM GOAL #1   Title Pt will report understanding and adherence to her HEP in order to promote independence in the management of her primary sxs.    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    Baseline Unable to stand longer than 1 hour due to knee pain    Time 8    Period Weeks    Status New    Target Date 12/03/20      PT LONG TERM GOAL #2   Title Pt will achieve WNL BIL quadriceps and hamstring extensibility in order to promote healthy knee mechanics during gait.    Baseline BIL quadriceps and hamstring moderately limited    Time 8    Period Weeks    Status New    Target Date 12/03/20      PT LONG TERM GOAL #3   Title Pt will achieve BIL global hip strength of 5/5 in order to progress independent exercise regimen without limitation.    Baseline BIL global hip strength ranging from 3+/5 to 4+/5    Time 8    Period Weeks    Status New    Target Date 12/03/20      PT LONG TERM GOAL #4   Title Pt will report no knee pain with BIL flexion AROM in order to resume walking her dog without limitation.    Baseline pain with BIL terminal knee flexion AROM    Time 8    Period Weeks    Status New    Target Date 12/03/20      PT LONG TERM GOAL #5   Title Pt will demonstrate ability to perform single leg stance x30 seconds with no step errors BIL in order to promote safe  community ambulation.    Baseline 1 step error on L in 15 seconds, 3 step errors on R in 15 seconds    Time 8    Period Weeks    Status New    Target Date 12/03/20                   Plan - 11/15/20 1549     Clinical Impression Statement Pt responded well to all interventions today with proper form an no increase in pain with selected exercises. Due to non-adherence to her HEP, no new exercises were added today as she was encouraged to perform the previously prescribed exercises. The pt demonstrates fair functional quadriceps strength as indicated by resisted knee exercises today. She will benefit from continued skilled PT to address her primary impairments and return to her prior level of function with less limitation.    Examination-Activity Limitations Bend;Sit;Sleep;Squat;Stairs;Stand    Examination-Participation Restrictions Community Activity;Shop    Stability/Clinical Decision Making Evolving/Moderate complexity    Clinical Decision Making Moderate    Rehab Potential Good    PT Frequency 2x / week    PT Duration 8 weeks    PT Treatment/Interventions ADLs/Self Care Home Management;Aquatic Therapy;Electrical Stimulation;Cryotherapy;Traction;Moist Heat;Gait training;Stair training;Functional mobility training;Therapeutic activities;Therapeutic exercise;Balance training;Neuromuscular re-education;Manual techniques;Joint Manipulations;Spinal Manipulations;Dry needling;Passive range of motion;Compression bandaging;Patient/family education;Taping;Manual lymph drainage;Vasopneumatic Device    PT Next Visit Plan Assess elbow pain, further assess LBP, progress BIL LE strengthening, introduce aerobics as able    PT Home Exercise Plan [id number]    Consulted and Agree with Plan of Care Patient             Patient will benefit from skilled therapeutic intervention in order to improve the following deficits and impairments:  Difficulty walking, Pain, Impaired flexibility, Hypermobility,  Decreased strength, Increased edema, Decreased balance  Visit Diagnosis: Chronic pain of left knee  Chronic pain of right knee  Pain in left elbow  Pain in right elbow  Chronic bilateral low back pain without sciatica  Muscle weakness (generalized)  Unsteadiness on feet     Problem List There are no problems to display for this patient.   Theresa Holt, PT, DPT 11/15/20 4:10 PM   [facility] [address] [place], [place], [address] Phone: [phone]   Fax:  [phone]  Name: Theresa  MRN: [id number] Date of Birth: Nov 23, 1985        PHYSICAL THERAPY DISCHARGE SUMMARY  Visits from Start of Care: 3  Current functional level related to goals / functional outcomes: See goals   Remaining deficits: Current status unknown due to pt not returning   Education / Equipment: HEP, theraband, posture,    Patient agrees to discharge. Patient goals were partially met. Patient is being discharged due to not returning since the last visit.   Kristoffer Leamon PT, DPT, LAT, ATC  12/08/20  12:19 PM

## 2020-11-29 ENCOUNTER — Telehealth: Payer: Self-pay

## 2020-11-29 NOTE — Telephone Encounter (Signed)
Spoke with pt regarding her first no-show. Discussed the clinic attendance policy and instructed pt to call clinic to schedule future appointments.

## 2020-12-23 IMAGING — US US OB < 14 WEEKS - US OB TV
1 series · 13 of 28 positions shown · non-contrast
Comparison: 09/11/2019

CLINICAL DATA: Left lower quadrant pain for 1 day. Estimated
gestational age by LMP is 6 weeks 3 days. Positive urine pregnancy
test. No quantitative beta hCG obtained.

EXAM:
OBSTETRIC <14 WK US AND TRANSVAGINAL OB US
TECHNIQUE: Both transabdominal and transvaginal ultrasound examinations were
performed for complete evaluation of the gestation as well as the
maternal uterus, adnexal regions, and pelvic cul-de-sac.
Transvaginal technique was performed to assess early pregnancy.

[Series 1: us ob less than 14 weeks with ob transvaginal · 13 of 71 slices shown]
[im 3/71]
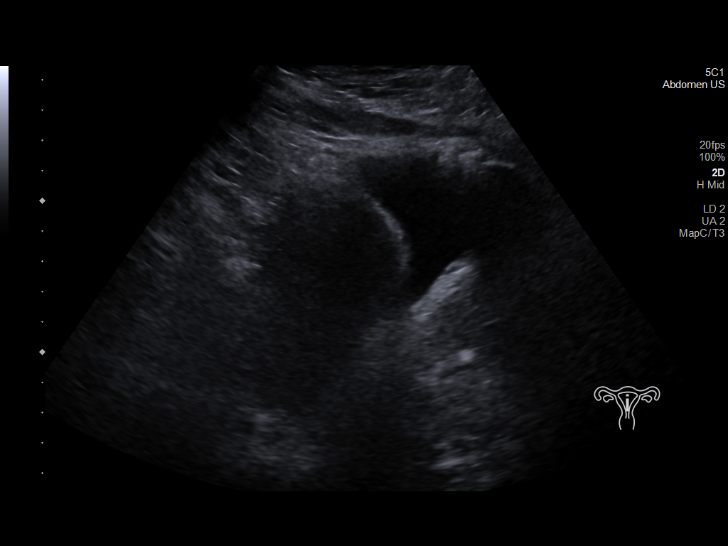
[im 8/71]
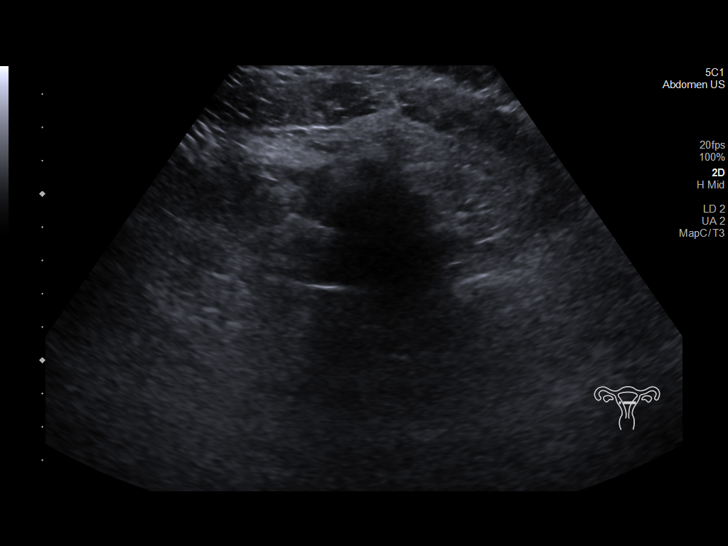
[im 13/71]
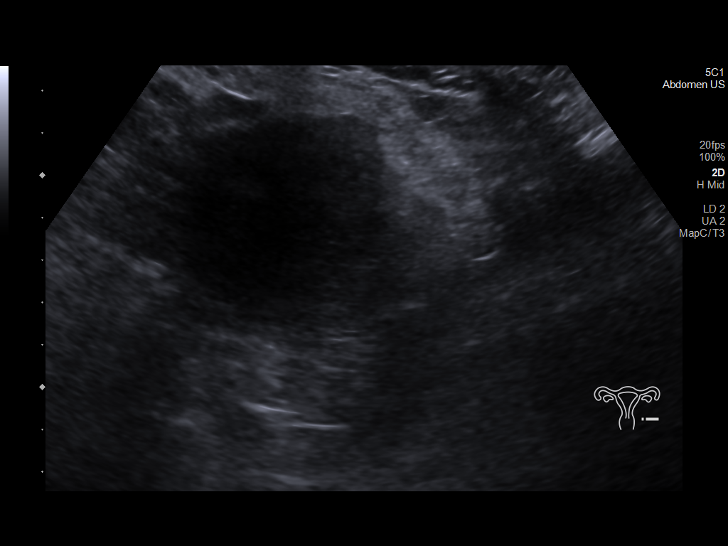
[im 19/71]
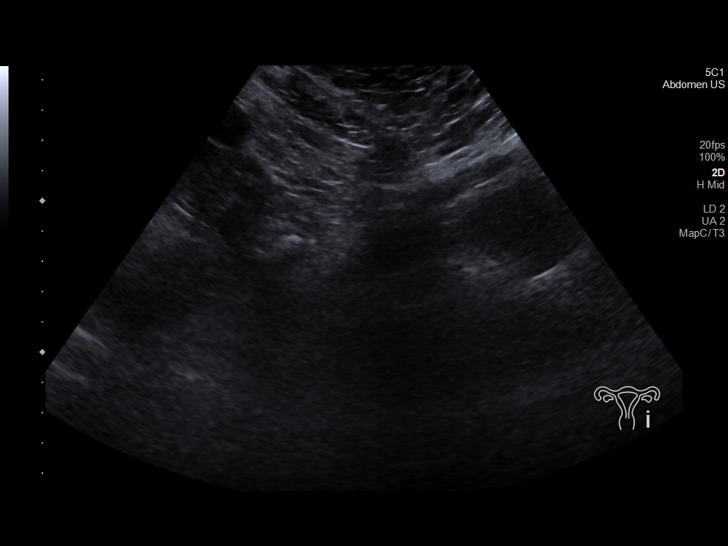
[im 24/71]
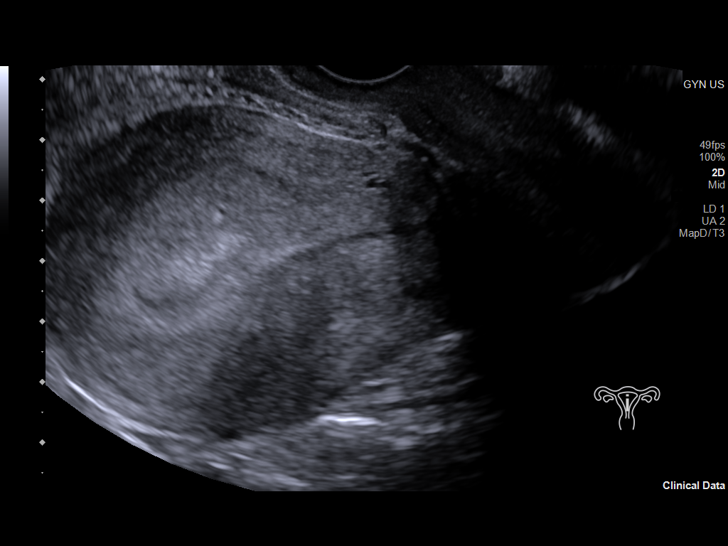
[im 29/71]
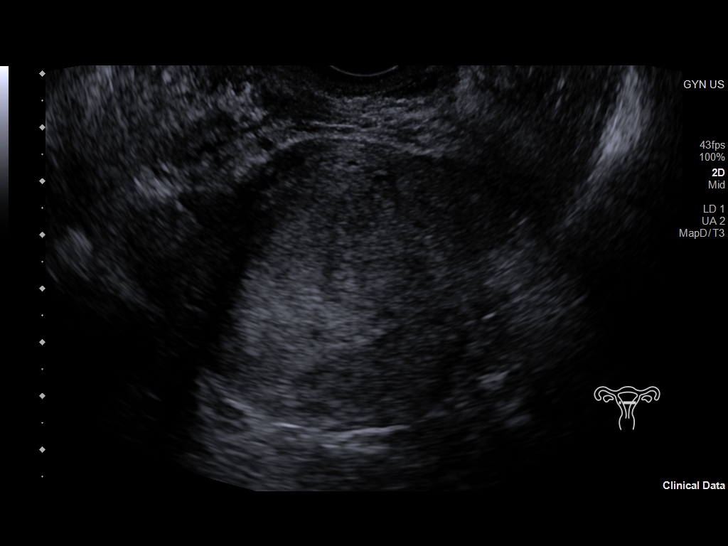
[im 37/71]
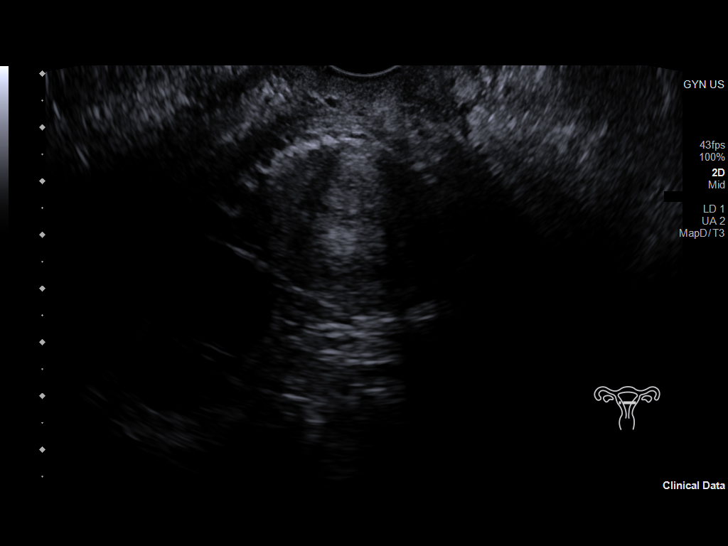
[im 42/71]
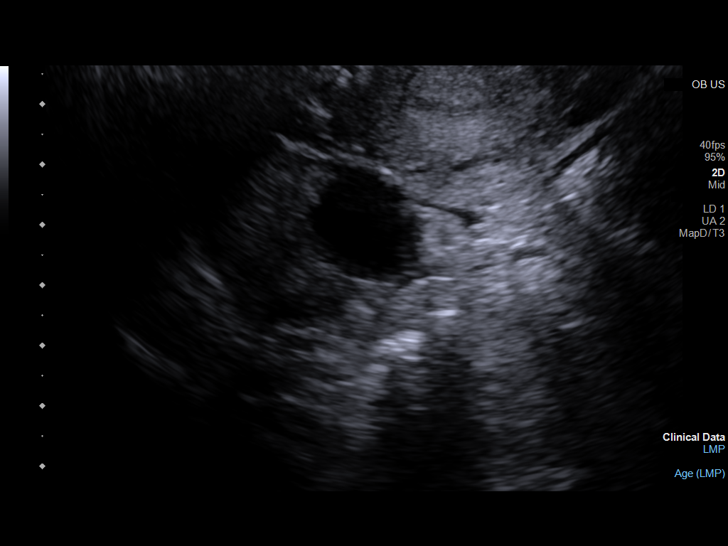
[im 47/71]
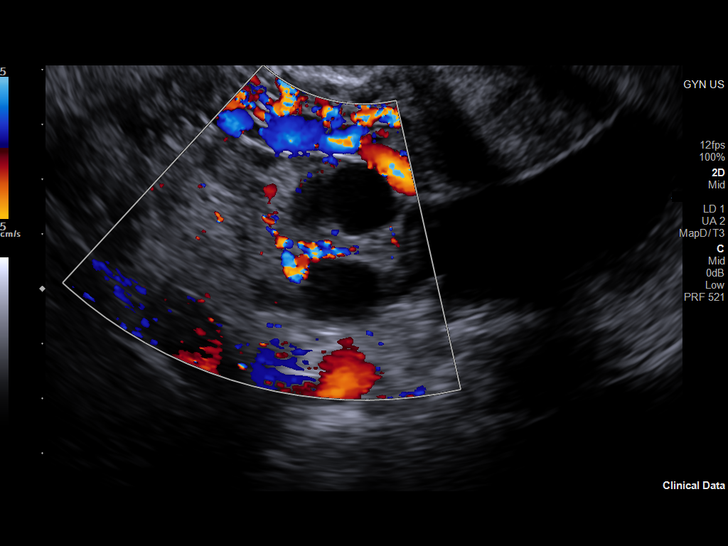
[im 52/71]
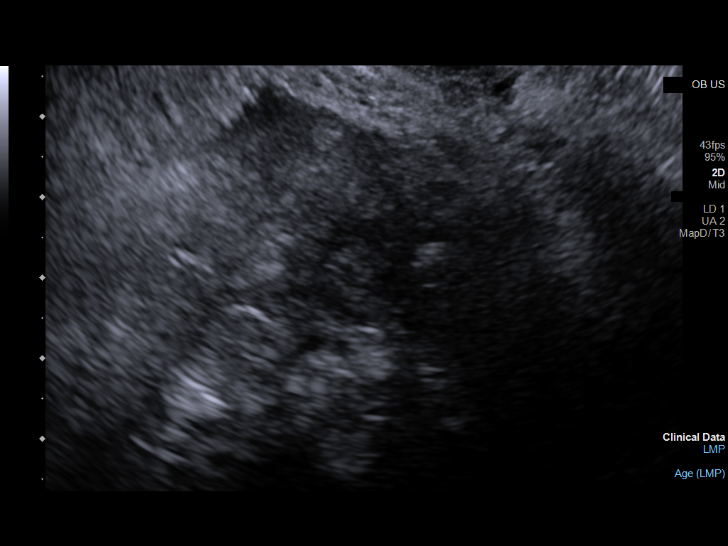
[im 58/71]
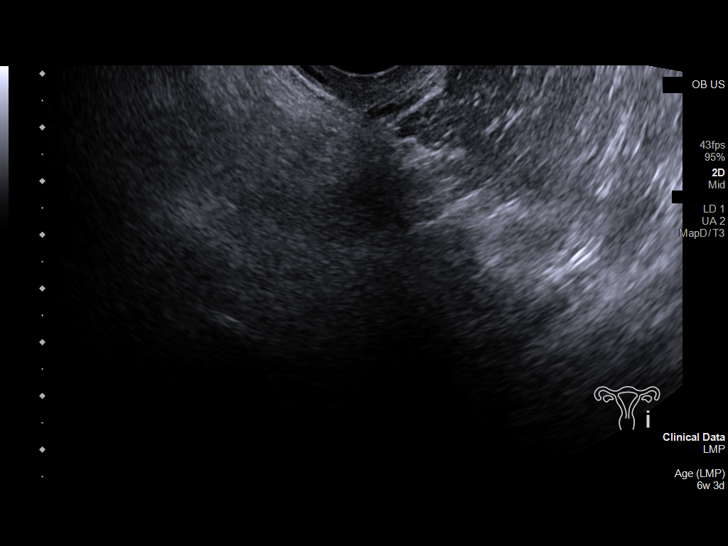
[im 63/71]
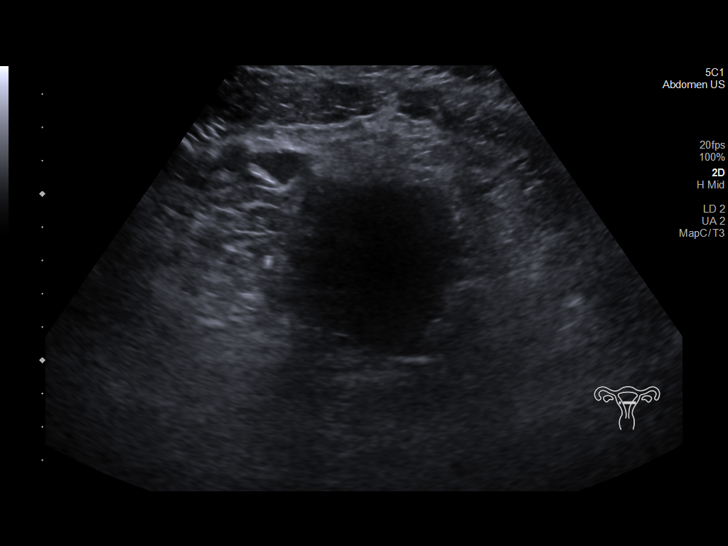
[im 68/71]
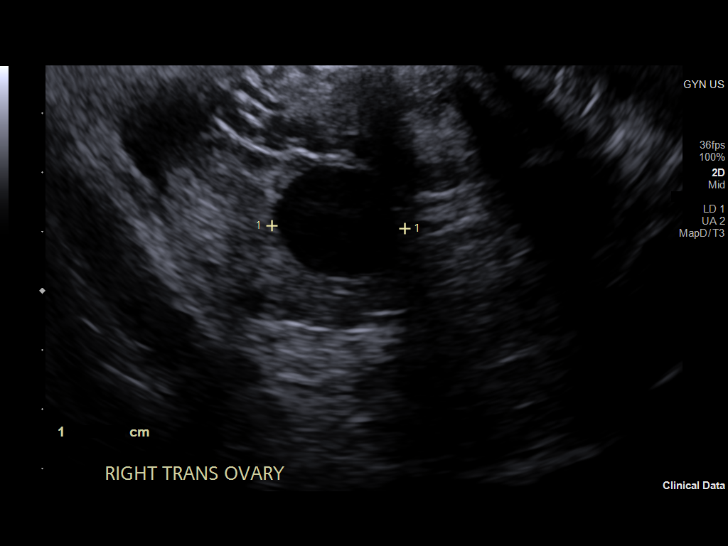

[13 of 28 positions shown; findings below may reference images not displayed]

FINDINGS: Intrauterine gestational sac: No intrauterine gestational sac is
identified.

Yolk sac:  Not identified.

Embryo:  Not identified.

Cardiac Activity: Not identified.

Maternal uterus/adnexae: No myometrial mass lesions are identified.
Fluid in the endometrial stripe. Endometrium is thickened at 2.5 cm.
Similar appearance to previous study. Right ovary measures 4.2 x
by 3.6 cm. Cyst measuring 2.3 cm diameter, likely corpus luteal
cyst. Left ovary measures 2.1 x 1.6 by 1.9 cm. Normal appearance. No
abnormal adnexal masses. Moderate free fluid is demonstrated in the
abdomen.
IMPRESSION: Pregnancy of unknown location. No intrauterine gestational sac is
identified. Differential diagnosis could include early intrauterine
pregnancy, too small to see, occult ectopic pregnancy, or failed
pregnancy. Recommend correlation with serum quantitative beta HCG
levels and follow-up ultrasound in 7-10 days or as clinically
indicated.

## 2021-01-15 ENCOUNTER — Emergency Department (HOSPITAL_COMMUNITY)
Admission: EM | Admit: 2021-01-15 | Discharge: 2021-01-15 | Discharge status: Against Medical Advice | Payer: Medicaid Other | Attending: Emergency Medicine | Admitting: Emergency Medicine

## 2021-01-15 ENCOUNTER — Other Ambulatory Visit: Payer: Self-pay

## 2021-01-15 ENCOUNTER — Encounter (HOSPITAL_COMMUNITY): Payer: Self-pay | Admitting: Emergency Medicine

## 2021-01-15 ENCOUNTER — Emergency Department (HOSPITAL_COMMUNITY): Payer: Medicaid Other

## 2021-01-15 DIAGNOSIS — Z3A01 Less than 8 weeks gestation of pregnancy: Secondary | ICD-10-CM | POA: Diagnosis not present

## 2021-01-15 DIAGNOSIS — R102 Pelvic and perineal pain: Secondary | ICD-10-CM | POA: Insufficient documentation

## 2021-01-15 DIAGNOSIS — O26891 Other specified pregnancy related conditions, first trimester: Secondary | ICD-10-CM | POA: Insufficient documentation

## 2021-01-15 DIAGNOSIS — R1032 Left lower quadrant pain: Secondary | ICD-10-CM

## 2021-01-15 LAB — URINALYSIS, ROUTINE W REFLEX MICROSCOPIC
Bacteria, UA: NONE SEEN
Bilirubin Urine: NEGATIVE
Glucose, UA: NEGATIVE mg/dL
Hgb urine dipstick: NEGATIVE
Ketones, ur: NEGATIVE mg/dL
Nitrite: NEGATIVE
Protein, ur: NEGATIVE mg/dL
Specific Gravity, Urine: 1.019 (ref 1.005–1.030)
pH: 7 (ref 5.0–8.0)

## 2021-01-15 LAB — CBC
HCT: 42 % (ref 36.0–46.0)
Hemoglobin: 12.4 g/dL (ref 12.0–15.0)
MCH: 26.2 pg (ref 26.0–34.0)
MCHC: 29.5 g/dL — ABNORMAL LOW (ref 30.0–36.0)
MCV: 88.6 fL (ref 80.0–100.0)
Platelets: 333 10*3/uL (ref 150–400)
RBC: 4.74 MIL/uL (ref 3.87–5.11)
RDW: 13.2 % (ref 11.5–15.5)
WBC: 9 10*3/uL (ref 4.0–10.5)
nRBC: 0 % (ref 0.0–0.2)

## 2021-01-15 LAB — COMPREHENSIVE METABOLIC PANEL
ALT: 19 U/L (ref 0–44)
AST: 21 U/L (ref 15–41)
Albumin: 3.3 g/dL — ABNORMAL LOW (ref 3.5–5.0)
Alkaline Phosphatase: 77 U/L (ref 38–126)
Anion gap: 9 (ref 5–15)
BUN: 8 mg/dL (ref 6–20)
CO2: 24 mmol/L (ref 22–32)
Calcium: 9.4 mg/dL (ref 8.9–10.3)
Chloride: 106 mmol/L (ref 98–111)
Creatinine, Ser: 0.97 mg/dL (ref 0.44–1.00)
GFR, Estimated: >60 mL/min (ref >60)
Glucose, Bld: 91 mg/dL (ref 70–99)
Potassium: 4.3 mmol/L (ref 3.5–5.1)
Sodium: 139 mmol/L (ref 135–145)
Total Bilirubin: 0.4 mg/dL (ref 0.3–1.2)
Total Protein: 7.1 g/dL (ref 6.5–8.1)

## 2021-01-15 LAB — LIPASE, BLOOD: Lipase: 29 U/L (ref 11–51)

## 2021-01-15 LAB — I-STAT BETA HCG BLOOD, ED (MC, WL, AP ONLY): I-stat hCG, quantitative: 52 m[IU]/mL — ABNORMAL HIGH (ref <5)

## 2021-01-15 MED ORDER — METRONIDAZOLE 500 MG PO TABS: 500.0000 mg | ORAL_TABLET | Freq: Two times a day (BID) | ORAL | 0 refills | Status: DC

## 2021-01-15 MED ORDER — METRONIDAZOLE 500 MG PO TABS
500.0000 mg | ORAL_TABLET | Freq: Two times a day (BID) | ORAL | 0 refills | Status: DC
Start: 2021-01-15 — End: 2021-09-09

## 2021-01-15 NOTE — ED Provider Notes (Signed)
[facility] EMERGENCY DEPARTMENT Provider Note   CSN: [id number] Arrival date & time: 01/15/21  1007     History Chief Complaint  Patient presents with   Abdominal Pain    Theresa Holt is a 35 y.o. female.  Presenting to ER with concern for abdominal pain.  Has had abdominal pain for about 1 month, generalized discomfort, bloating.  Worse on the left side.  Seems to be worse certain times today.  Has OB/GYN and [facility] system.  She denies any vaginal bleeding.  No vaginal discharge.  Does have prior history of ectopic pregnancy.  Reports that she was at her OB/GYN 4 days ago, her gynecologist completed a pelvic exam and sent testing for gonorrhea chlamydia and trichomonas.  She is unsure of the results.  Per review of chart through care everywhere, gonorrhea chlamydia was negative for trichomonas was positive.  Rh positive per chart review.  HPI     History reviewed. No pertinent past medical history.  There are no problems to display for this patient.   Past Surgical History:  Procedure Laterality Date   CESAREAN SECTION     TUBAL LIGATION     tubal reversal       OB History   No obstetric history on file.     No family history on file.  Social History   Tobacco Use   Smoking status: Never   Smokeless tobacco: Never  Substance Use Topics   Alcohol use: No   Drug use: No    Home Medications Prior to Admission medications   Medication Sig Start Date End Date Taking? Authorizing Provider  cephALEXin (KEFLEX) 500 MG capsule Take 1 capsule (500 mg total) by mouth 3 (three) times daily. Patient not taking: No sig reported 09/14/19   Devoria Albe, MD  diphenhydrAMINE (BENADRYL) 25 MG tablet Take 50 mg by mouth at bedtime as needed for itching or sleep. Patient not taking: No sig reported    [provider]  HYDROcodone-acetaminophen (NORCO/VICODIN) 5-325 MG tablet Take 1 tablet by mouth every 6 (six) hours as needed for moderate  pain. Patient not taking: Reported on 10/08/2020    [provider]  HYDROmorphone (DILAUDID) 2 MG tablet Take 2 mg by mouth every 6 (six) hours as needed for severe pain.    [provider]  ibuprofen (ADVIL,MOTRIN) 800 MG tablet Take 1 tablet (800 mg total) by mouth 3 (three) times daily. Patient not taking: No sig reported 11/23/16   Bethann Berkshire, MD  letrozole [facility]) 2.5 MG tablet Take 2.5 mg by mouth daily. Patient not taking: Reported on 10/08/2020    [provider]  metFORMIN (GLUCOPHAGE) 500 MG tablet Take 500 mg by mouth 2 (two) times daily with a meal. Patient not taking: No sig reported    [provider]  methocarbamol (ROBAXIN) 500 MG tablet Take 500 mg by mouth 4 (four) times daily. Patient not taking: Reported on 10/08/2020    [provider]  metroNIDAZOLE (FLAGYL) 500 MG tablet Take 1 tablet (500 mg total) by mouth 2 (two) times daily. 01/15/21   Milagros Loll, MD  predniSONE (DELTASONE) 10 MG tablet 6, 5, 4, 3, 2 then 1 tablet by mouth daily for 6 days total. 11/20/13   Idol, Raynelle Fanning, PA-C  PRESCRIPTION MEDICATION HCG shot once a month for fertility Patient not taking: No sig reported    [provider]  traMADol (ULTRAM) 50 MG tablet Take 1 tablet (50 mg total) by mouth every  6 (six) hours as needed. Patient not taking: No sig reported 11/23/16   Milton Ferguson, MD    Allergies    Codeine  Review of Systems   Review of Systems  Constitutional:  Negative for chills and fever.  HENT:  Negative for ear pain and sore throat.   Eyes:  Negative for pain and visual disturbance.  Respiratory:  Negative for cough and shortness of breath.   Cardiovascular:  Negative for chest pain and palpitations.  Gastrointestinal:  Positive for abdominal distention and abdominal pain. Negative for vomiting.  Genitourinary:  Negative for dysuria and hematuria.  Musculoskeletal:  Negative for arthralgias and back pain.  Skin:  Negative  for color change and rash.  Neurological:  Negative for seizures and syncope.  All other systems reviewed and are negative.  Physical Exam Updated Vital Signs BP 117/79   Pulse 88   Temp 98.5 F (36.9 C) (Oral)   Resp 20   SpO2 100%   Physical Exam Vitals and nursing note reviewed.  Constitutional:      General: She is not in acute distress.    Appearance: She is well-developed.  HENT:     Head: Normocephalic and atraumatic.  Eyes:     Conjunctiva/sclera: Conjunctivae normal.  Cardiovascular:     Rate and Rhythm: Normal rate and regular rhythm.     Heart sounds: No murmur heard. Pulmonary:     Effort: Pulmonary effort is normal. No respiratory distress.     Breath sounds: Normal breath sounds.  Abdominal:     Palpations: Abdomen is soft.     Tenderness: There is no abdominal tenderness.  Musculoskeletal:        General: No swelling.     Cervical back: Neck supple.  Skin:    General: Skin is warm and dry.     Capillary Refill: Capillary refill takes less than 2 seconds.  Neurological:     Mental Status: She is alert.  Psychiatric:        Mood and Affect: Mood normal.    ED Results / Procedures / Treatments   Labs (all labs ordered are listed, but only abnormal results are displayed) Labs Reviewed  COMPREHENSIVE METABOLIC PANEL - Abnormal; Notable for the following components:      Result Value   Albumin 3.3 (*)    All other components within normal limits  CBC - Abnormal; Notable for the following components:   MCHC 29.5 (*)    All other components within normal limits  URINALYSIS, ROUTINE W REFLEX MICROSCOPIC - Abnormal; Notable for the following components:   APPearance HAZY (*)    Leukocytes,Ua TRACE (*)    All other components within normal limits  I-STAT BETA HCG BLOOD, ED ([facility], [facility], [facility] ONLY) - Abnormal; Notable for the following components:   I-stat hCG, quantitative 52.0 (*)    All other components within normal limits  LIPASE, BLOOD  HCG,  QUANTITATIVE, PREGNANCY    EKG None  Radiology No results found.  Procedures Procedures   Medications Ordered in ED Medications - No data to display  ED Course  I have reviewed the triage vital signs and the nursing notes.  Pertinent labs & imaging results that were available during my care of the patient were reviewed by me and considered in my medical decision making (see chart for details).    MDM Rules/Calculators/A&P  35 year old lady presents to ER with concern for generalized abdominal discomfort.  On exam she appears well in no distress, abdomen is soft.  Initially in triage she did not mention being pregnant however states that she has had positive home pregnancy test earlier this week.  I-STAT beta was positive.  Given her reported abdominal discomfort, and past history of ectopic pregnancy, I strongly recommended patient have ultrasound completed and be seen at [facility].  Had discussed case with [facility] and patient was accepted to go over for further testing and management.  However patient decided that she wanted to go home and follow-up with her gynecologist.  Lengthy discussion with patient about risk of discharge without further testing and evaluation.  Patient demonstrated understanding of my concerns.  Will be discharged AMA.  She states that she will call her gynecologist Monday morning to request follow-up appointment, additionally recommended getting ultrasound and quant repeat.  Per results of care everywhere, the patient was tested positive for trichomonas 4 days ago.  Will treat with course of Flagyl.   Final Clinical Impression(s) / ED Diagnoses Final diagnoses:  Left lower quadrant abdominal pain  Less than [redacted] weeks gestation of pregnancy    Rx / DC Orders ED Discharge Orders          Ordered    metroNIDAZOLE (FLAGYL) 500 MG tablet  2 times daily,   Status:  Discontinued        01/15/21 1634    metroNIDAZOLE (FLAGYL) 500 MG tablet  2  times daily        01/15/21 1647             Milagros Loll, MD 01/15/21 1740

## 2021-01-15 NOTE — ED Provider Notes (Cosign Needed)
Emergency Medicine Provider Triage Evaluation Note  Theresa Holt , a 35 y.o. female  was evaluated in triage.  Pt complains of abdominal pain.  She states that she has had lower abdominal pain for 1 month.  She states that she describes it as discomfort and bloating.  She states that she also has left flank pain.  She states that she wakes up with discomfort and bloating and then at night she feels nauseous.  She denies any vomiting or diarrhea.  She denies any urinary symptoms.  She does state that her last period was October 7.  She states that she missed her November.  And saw her OB/GYN who gave her Provera to initiate a period.  She states that she only had 1 day of streaky bleeding and is still not had a full period since then.  She is G4, P3 with 1 ectopic.  Denies fevers.  Review of Systems  Positive: See above Negative:   Physical Exam  There were no vitals taken for this visit. Gen:   Awake, no distress   Resp:  Normal effort  MSK:   Moves extremities without difficulty  Other:  Positive bowel sounds.  Abdomen is protuberant, soft, tenderness to left lower quadrant and right lower quadrant..  Medical Decision Making  Medically screening exam initiated at 12:40 PM.  Appropriate orders placed.  Theresa Holt was informed that the remainder of the evaluation will be completed by another provider, this initial triage assessment does not replace that evaluation, and the importance of remaining in the ED until their evaluation is complete.     Cristopher Peru, PA-C 01/15/21 1242

## 2021-01-15 NOTE — Discharge Instructions (Signed)
We recommended staying for the ultrasound and evaluation by gynecology however you have chosen to leave against our medical advice.  Please follow-up with your gynecologist on Monday.  You should have repeat beta quant testing and ultrasound completed.  The trichomonas test from 12/6 came back positive.  Would recommend taking treatment.  Your partner should also receive treatment and you should not have any sexual interaction for at least 1 week after they have both completed treatment.  Use safe sex practices.  If you have any increase in abdominal pain, vaginal bleeding or if you change your mind, please come back to ER for evaluation.

## 2021-01-15 NOTE — ED Triage Notes (Signed)
Pt reports lower abd pain and L flank pain x 1 month with nausea at night.  Denies dysuria.  Reports urine cloudy at times.

## 2021-04-20 ENCOUNTER — Ambulatory Visit: Payer: Medicaid Other | Admitting: Physician Assistant

## 2021-05-30 DIAGNOSIS — H903 Sensorineural hearing loss, bilateral: Secondary | ICD-10-CM | POA: Insufficient documentation

## 2021-07-21 IMAGING — CR DG CHEST 2V
2 series · 2 of 2 positions shown · non-contrast
Comparison: 11/23/2016

CLINICAL DATA: Chest pain

EXAM:
CHEST - 2 VIEW

[chest pa]
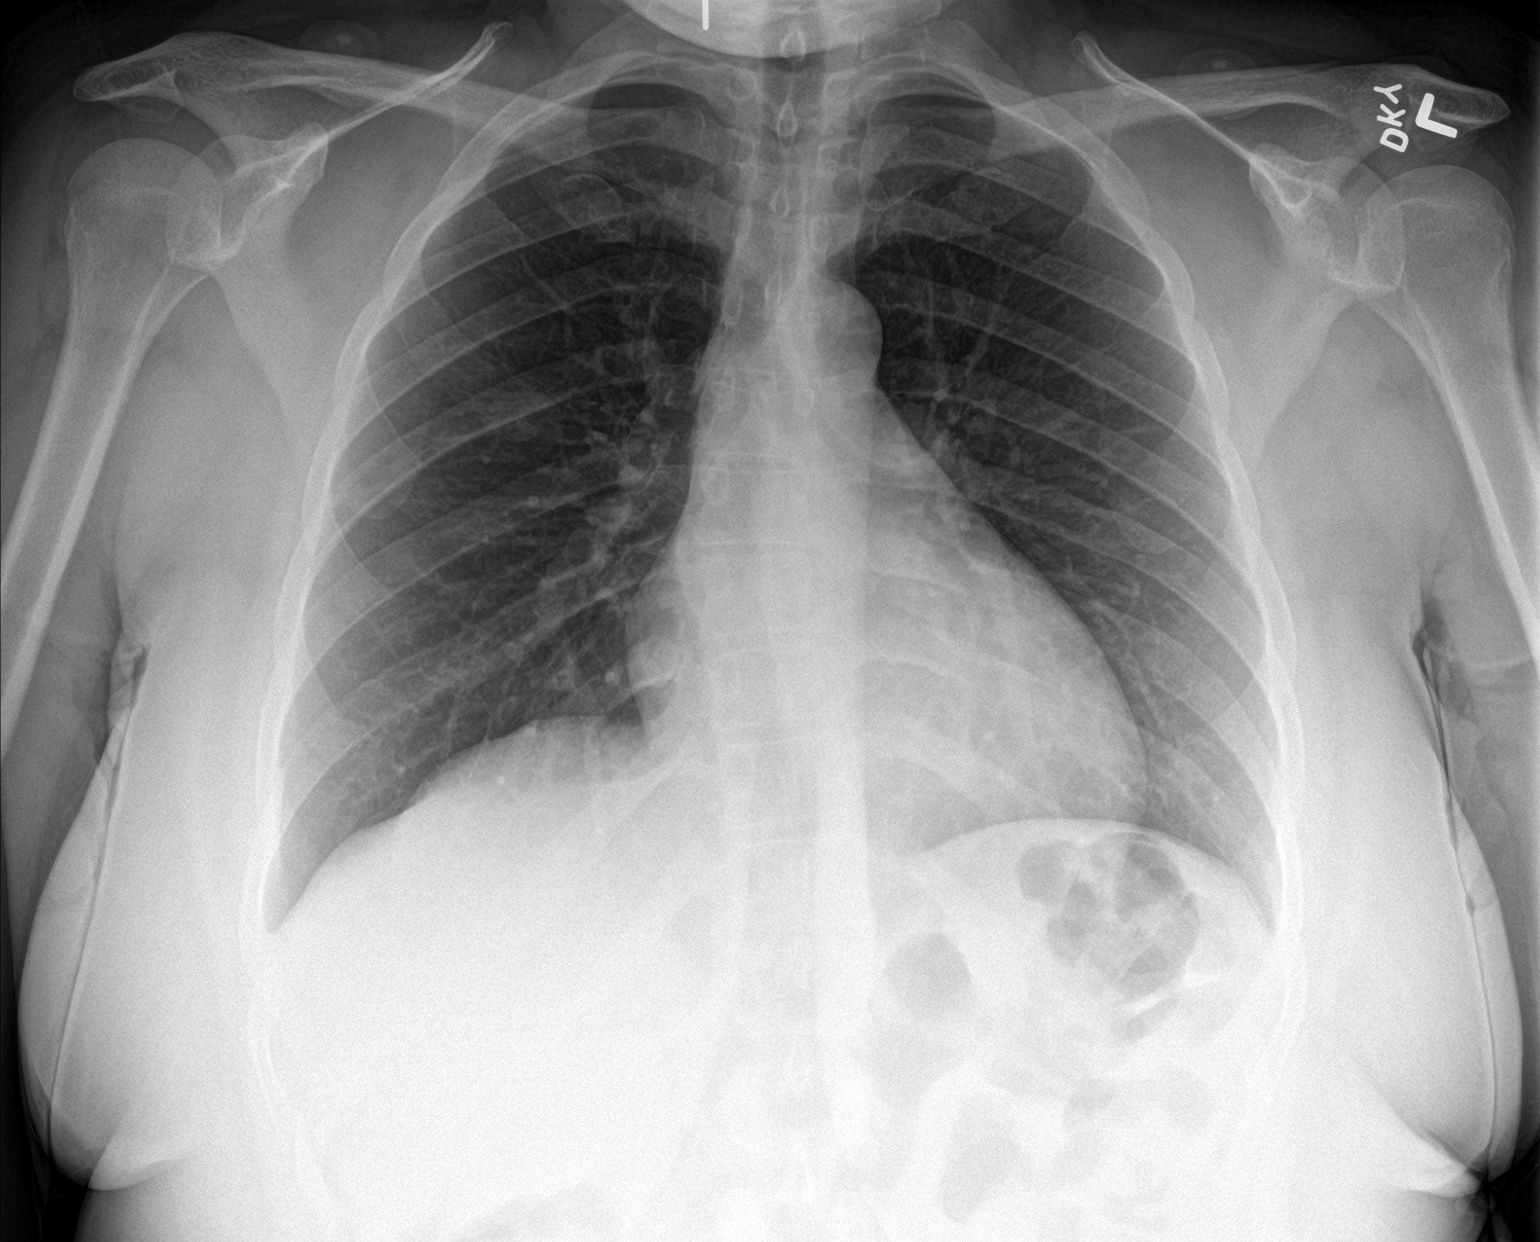

[chest lat]
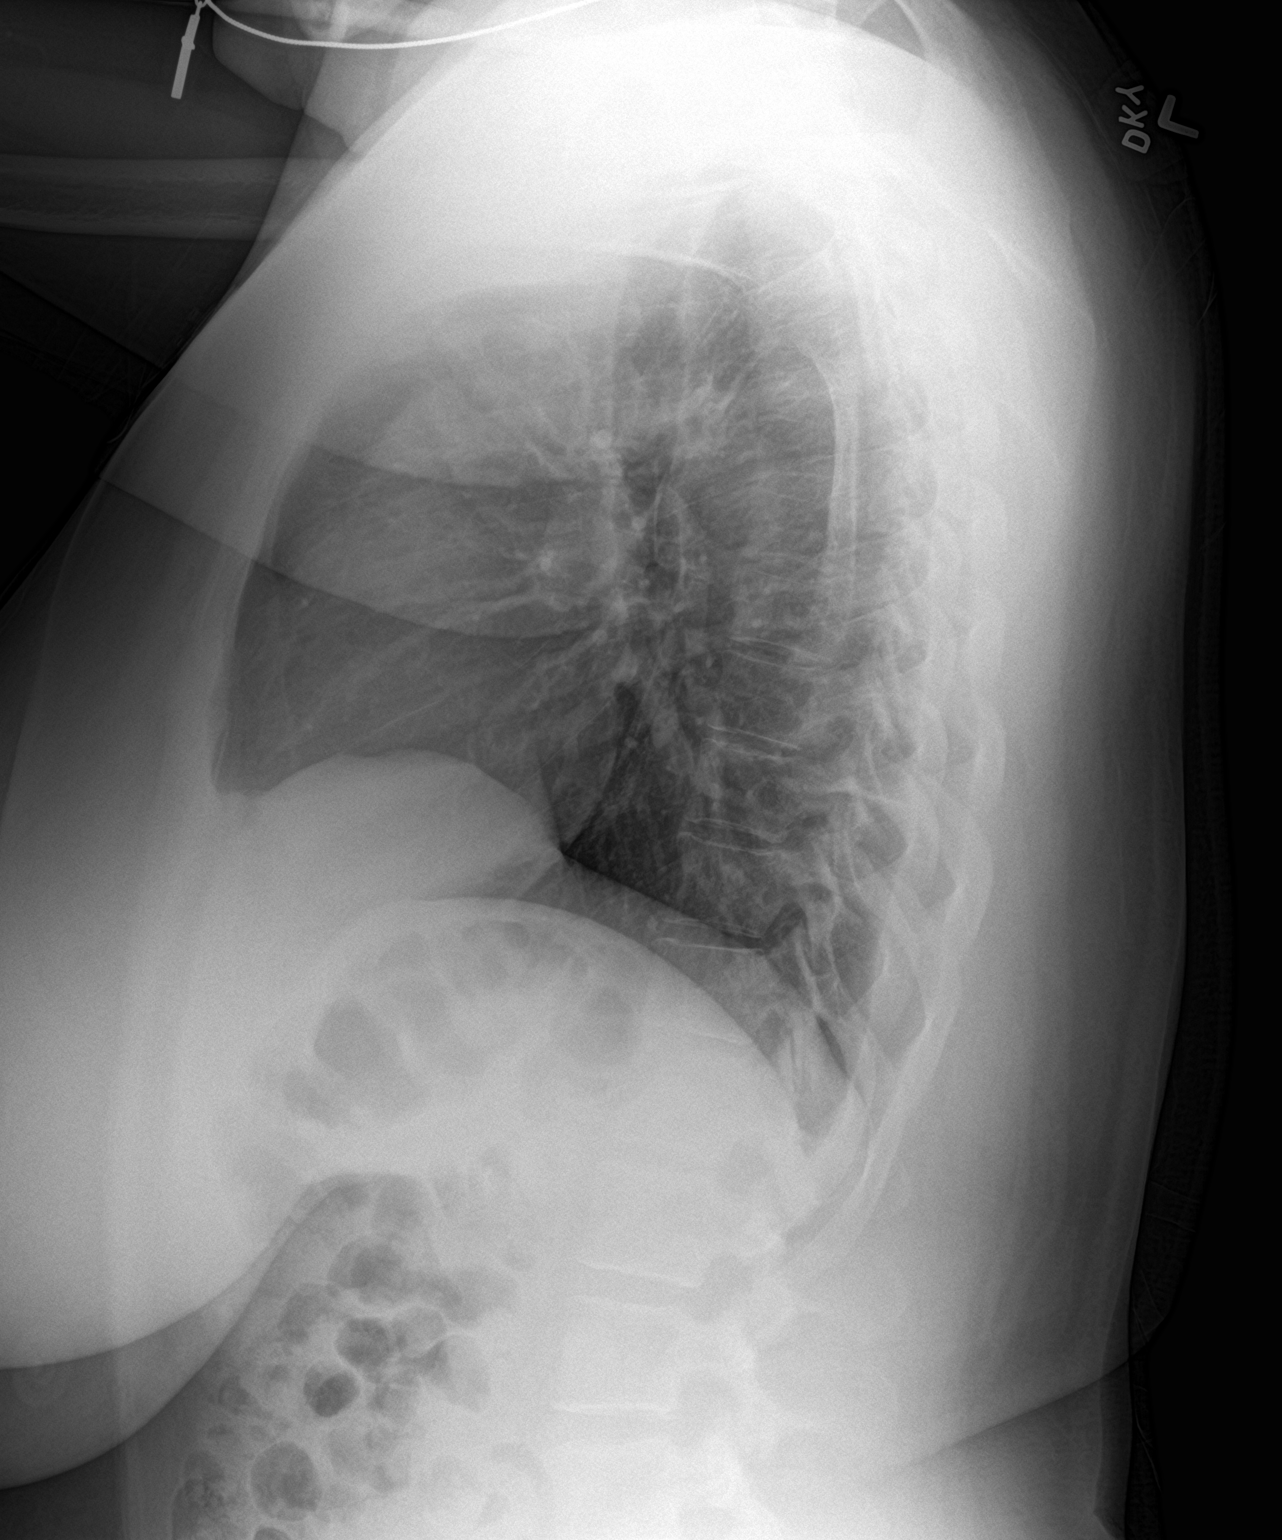

[2 of 2 positions shown; findings below may reference images not displayed]

FINDINGS: No consolidation, features of edema, pneumothorax, or effusion.
Pulmonary vascularity is normally distributed. The cardiomediastinal
contours are unremarkable. No acute osseous or soft tissue
abnormality.
IMPRESSION: No acute cardiopulmonary abnormality.

## 2021-09-09 ENCOUNTER — Ambulatory Visit (HOSPITAL_COMMUNITY)
Admission: EM | Admit: 2021-09-09 | Discharge: 2021-09-09 | Disposition: A | Discharge status: Home / Self Care | Payer: Medicaid Other | Attending: Internal Medicine | Admitting: Internal Medicine

## 2021-09-09 ENCOUNTER — Encounter (HOSPITAL_COMMUNITY): Payer: Self-pay

## 2021-09-09 DIAGNOSIS — Z3202 Encounter for pregnancy test, result negative: Secondary | ICD-10-CM | POA: Diagnosis not present

## 2021-09-09 DIAGNOSIS — R109 Unspecified abdominal pain: Secondary | ICD-10-CM

## 2021-09-09 DIAGNOSIS — N760 Acute vaginitis: Secondary | ICD-10-CM | POA: Insufficient documentation

## 2021-09-09 DIAGNOSIS — Z113 Encounter for screening for infections with a predominantly sexual mode of transmission: Secondary | ICD-10-CM | POA: Diagnosis not present

## 2021-09-09 DIAGNOSIS — R1030 Lower abdominal pain, unspecified: Secondary | ICD-10-CM | POA: Diagnosis present

## 2021-09-09 LAB — POCT URINALYSIS DIPSTICK, ED / UC
Bilirubin Urine: NEGATIVE
Glucose, UA: NEGATIVE mg/dL
Ketones, ur: NEGATIVE mg/dL
Nitrite: NEGATIVE
Protein, ur: NEGATIVE mg/dL
Specific Gravity, Urine: 1.015 (ref 1.005–1.030)
Urobilinogen, UA: 2 mg/dL — ABNORMAL HIGH (ref 0.0–1.0)
pH: 6 (ref 5.0–8.0)

## 2021-09-09 LAB — HCG, QUANTITATIVE, PREGNANCY: hCG, Beta Chain, Quant, S: <1 m[IU]/mL (ref <5)

## 2021-09-09 LAB — POC URINE PREG, ED: Preg Test, Ur: NEGATIVE

## 2021-09-09 LAB — HIV ANTIBODY (ROUTINE TESTING W REFLEX): HIV Screen 4th Generation wRfx: NONREACTIVE

## 2021-09-09 MED ORDER — IBUPROFEN 800 MG PO TABS: 800.0000 mg | ORAL_TABLET | Freq: Once | ORAL | Status: DC

## 2021-09-09 NOTE — Discharge Instructions (Addendum)
Urine pregnancy test is negative.  Your STD testing has been sent to the lab and will come back in the next 2 to 3 days.  We will call you if any of your results are positive requiring treatment and treat you at that time.   Avoid sexual intercourse until your STD results come back.  If any of your STD results are positive, you will need to avoid sexual intercourse for 7 days while you are being treated to prevent spread of STD.  Condom use is the best way to prevent spread of STDs.  Your urine does show some white blood cells. I'm going to send this for a urine culture to make sure you don't have a urinary tract infection. If your urine grows bacteria, I will call you and prescribe an antibiotic at that time.   You make take ibuprofen 600mg  and tylenol 1,000mg  every 6 hours as needed for abdominal cramping at home.   Increase your water intake to at least 8 cups of water per day and avoid drinking sugary beverages as these are considered urinary irritants and can cause frequent urinary tract infections.   Return to urgent care as needed.

## 2021-09-09 NOTE — ED Provider Notes (Signed)
[facility]    CSN: [id number] Arrival date & time: 09/09/21  1216      History   Chief Complaint Chief Complaint  Patient presents with   Abdominal Pain    HPI Theresa Holt is a 36 y.o. female.   Patient presents to urgent care for evaluation of 2-week history of abdominal cramping.  Patient states that usually when she gets abdominal cramping to the lower abdomen, this means that she is about to start her menstrual cycle however she is 6 days late.  Patient has been trying to conceive since 2016 and had 1 negative pregnancy test 3 days ago at home.  Patient has had some new sexual partners recently and is requesting STI testing today as well.  Reports vaginal discharge that is clear and without odor/itch.  Denies urinary frequency, dysuria, urgency, and fever/chills but does report a small amount of lower back pain that is worse with movement.  No nausea, vomiting, dizziness, or constipation reported.  Patient had 1 episode of diarrhea this morning that was without blood or mucus to the stools.  She admits that she does not drink very much water and is attempting to be better about this and drink more.  No known exposure to STIs reported.  Last menstrual period was on August 02, 2021.  She has undergone a C-section in the past with tubal ligation then tubal ligation reversal.  Patient became pregnant in 2021 and experienced an ectopic pregnancy.  She has not had any vaginal bleeding since her last menstrual cycle on August 02, 2021.  Abdominal cramping is currently a 7 on a scale of 0-10 and comes and goes.  She has not taken any medications for her abdominal cramping prior to arrival urgent care.  No other triggering or relieving factors identified at this time for patient's symptoms.   Abdominal Pain   History reviewed. No pertinent past medical history.  There are no problems to display for this patient.   Past Surgical History:  Procedure Laterality Date   CESAREAN  SECTION     TUBAL LIGATION     tubal reversal      OB History   No obstetric history on file.      Home Medications    Prior to Admission medications   Medication Sig Start Date End Date Taking? Authorizing Provider  letrozole (FEMARA) 2.5 MG tablet Take 2.5 mg by mouth daily. Patient not taking: Reported on 10/08/2020    [provider]    Family History History reviewed. No pertinent family history.  Social History Social History   Tobacco Use   Smoking status: Never   Smokeless tobacco: Never  Substance Use Topics   Alcohol use: No   Drug use: No     Allergies   Codeine   Review of Systems Review of Systems  Gastrointestinal:  Positive for abdominal pain.  Per HPI   Physical Exam Triage Vital Signs ED Triage Vitals  Enc Vitals Group     BP 09/09/21 1227 137/88     Pulse Rate 09/09/21 1227 80     Resp 09/09/21 1227 18     Temp 09/09/21 1227 98.7 F (37.1 C)     Temp Source 09/09/21 1227 Oral     SpO2 09/09/21 1227 96 %     Weight --      Height --      Head Circumference --      Peak Flow --  Pain Score 09/09/21 1228 7     Pain Loc --      Pain Edu? --      Excl. in GC? --    No data found.  Updated Vital Signs BP 137/88 (BP Location: Left Arm)   Pulse 80   Temp 98.7 F (37.1 C) (Oral)   Resp 18   SpO2 96%   Visual Acuity Right Eye Distance:   Left Eye Distance:   Bilateral Distance:    Right Eye Near:   Left Eye Near:    Bilateral Near:     Physical Exam Vitals and nursing note reviewed.  Constitutional:      Appearance: Normal appearance. She is obese. She is not ill-appearing or toxic-appearing.     Comments: Very pleasant patient sitting on exam in position of comfort table in no acute distress.   HENT:     Head: Normocephalic and atraumatic.     Right Ear: Hearing and external ear normal.     Left Ear: Hearing and external ear normal.     Nose: Nose normal.     Mouth/Throat:     Lips: Pink.     Mouth:  Mucous membranes are moist.  Eyes:     General: Lids are normal. Vision grossly intact. Gaze aligned appropriately.     Extraocular Movements: Extraocular movements intact.     Conjunctiva/sclera: Conjunctivae normal.  Pulmonary:     Effort: Pulmonary effort is normal.  Abdominal:     General: Bowel sounds are normal.     Palpations: Abdomen is soft.     Tenderness: There is abdominal tenderness in the right lower quadrant and left lower quadrant. There is no right CVA tenderness, left CVA tenderness or guarding. Negative signs include McBurney's sign.  Musculoskeletal:     Cervical back: Neck supple.  Skin:    General: Skin is warm and dry.     Capillary Refill: Capillary refill takes less than 2 seconds.     Findings: No rash.  Neurological:     General: No focal deficit present.     Mental Status: She is alert and oriented to person, place, and time. Mental status is at baseline.     Cranial Nerves: No dysarthria or facial asymmetry.     Gait: Gait is intact.  Psychiatric:        Mood and Affect: Mood normal.        Speech: Speech normal.        Behavior: Behavior normal.        Thought Content: Thought content normal.        Judgment: Judgment normal.      UC Treatments / Results  Labs (all labs ordered are listed, but only abnormal results are displayed) Labs Reviewed  POCT URINALYSIS DIPSTICK, ED / UC - Abnormal; Notable for the following components:      Result Value   Hgb urine dipstick MODERATE (*)    Urobilinogen, UA 2.0 (*)    Leukocytes,Ua TRACE (*)    All other components within normal limits  URINE CULTURE  HCG, QUANTITATIVE, PREGNANCY  HIV ANTIBODY (ROUTINE TESTING W REFLEX)  RPR  POC URINE PREG, ED  CERVICOVAGINAL ANCILLARY ONLY    EKG   Radiology No results found.  Procedures Procedures (including critical care time)  Medications Ordered in UC Medications  ibuprofen (ADVIL) tablet 800 mg (has no administration in time range)    Initial  Impression / Assessment and Plan / UC Course  I have reviewed the triage vital signs and the nursing notes.  Pertinent labs & imaging results that were available during my care of the patient were reviewed by me and considered in my medical decision making (see chart for details).    Abdominal discomfort Abdominal exam is without peritoneal signs.  Patient has hemodynamically stable vital signs.  No clinical indication for referral to the emergency department for urgent lab work or imaging at this time.  Urine pregnancy test is negative.  Urinalysis shows trace leukocytes and moderate hemoglobin.  Patient is without urinary symptoms so we will treat this based on urine culture.  Patient is agreeable with this plan.  STI testing is also pending which may be contributing to patient's abdominal discomfort.  I suspect that abdominal cramping is most likely related to patient's menstrual cycle.  She may use 600 mg of ibuprofen and 1000 mg of Tylenol every 6 hours as needed at home for abdominal cramping.  She may also apply heat to the abdomen to reduce cramping sensation as well.  We will call patient with results of urine culture and treat accordingly.  2. STD testing STI labs pending.  Patient requests HIV and syphilis testing today.  Will notify patient of positive results and treat accordingly when labs come back.  Patient to avoid sexual intercourse until screening testing comes back.  Education provided regarding safe sexual practices and patient encouraged to use protection to prevent spread of STIs.   Patient is requesting hCG quantitative blood pregnancy test today.  This test is pending.  We will call patient if result is positive.  Discussed physical exam and available lab work findings in clinic with patient.  Counseled patient regarding appropriate use of medications and potential side effects for all medications recommended or prescribed today. Discussed red flag signs and symptoms of  worsening condition,when to call the PCP office, return to urgent care, and when to seek higher level of care in the emergency department. Patient verbalizes understanding and agreement with plan. All questions answered. Patient discharged in stable condition.  Final Clinical Impressions(s) / UC Diagnoses   Final diagnoses:  Lower abdominal pain  Screen for STD (sexually transmitted disease)  Negative pregnancy test     Discharge Instructions      Urine pregnancy test is negative.  Your STD testing has been sent to the lab and will come back in the next 2 to 3 days.  We will call you if any of your results are positive requiring treatment and treat you at that time.   Avoid sexual intercourse until your STD results come back.  If any of your STD results are positive, you will need to avoid sexual intercourse for 7 days while you are being treated to prevent spread of STD.  Condom use is the best way to prevent spread of STDs.  Your urine does show some white blood cells. I'm going to send this for a urine culture to make sure you don't have a urinary tract infection. If your urine grows bacteria, I will call you and prescribe an antibiotic at that time.   You make take ibuprofen 600mg  and tylenol 1,000mg  every 6 hours as needed for abdominal cramping at home.   Increase your water intake to at least 8 cups of water per day and avoid drinking sugary beverages as these are considered urinary irritants and can cause frequent urinary tract infections.   Return to urgent care as needed.      ED  Prescriptions   None    PDMP not reviewed this encounter.   Carlisle Beers, [place] 09/09/21 1342

## 2021-09-09 NOTE — ED Triage Notes (Signed)
Pt c/o lower abdominal cramping x2wks. States she is 6 days late on her menstrual cycle. States had neg home preg test.   Requesting STD testing.

## 2021-09-10 LAB — URINE CULTURE

## 2021-09-10 LAB — RPR: RPR Ser Ql: NONREACTIVE

## 2021-09-12 LAB — CERVICOVAGINAL ANCILLARY ONLY
Bacterial Vaginitis (gardnerella): POSITIVE — AB
Candida Glabrata: NEGATIVE
Candida Vaginitis: NEGATIVE
Chlamydia: NEGATIVE
Comment: NEGATIVE
Comment: NEGATIVE
Comment: NEGATIVE
Comment: NEGATIVE
Comment: NEGATIVE
Comment: NORMAL
Neisseria Gonorrhea: NEGATIVE
Trichomonas: NEGATIVE

## 2021-09-13 ENCOUNTER — Telehealth (HOSPITAL_COMMUNITY): Payer: Self-pay | Admitting: Emergency Medicine

## 2021-09-13 MED ORDER — METRONIDAZOLE 0.75 % VA GEL: 1.0000 | Freq: Every day | VAGINAL | 0 refills | Status: AC

## 2022-04-13 ENCOUNTER — Encounter: Payer: Self-pay | Admitting: Radiology

## 2022-06-13 ENCOUNTER — Emergency Department (HOSPITAL_COMMUNITY)
Admission: EM | Admit: 2022-06-13 | Discharge: 2022-06-13 | Disposition: A | Discharge status: Home / Self Care | Payer: Medicaid Other | Attending: Emergency Medicine | Admitting: Emergency Medicine

## 2022-06-13 ENCOUNTER — Other Ambulatory Visit: Payer: Self-pay

## 2022-06-13 ENCOUNTER — Encounter (HOSPITAL_COMMUNITY): Payer: Self-pay

## 2022-06-13 ENCOUNTER — Emergency Department (HOSPITAL_COMMUNITY): Payer: Medicaid Other

## 2022-06-13 DIAGNOSIS — R072 Precordial pain: Secondary | ICD-10-CM | POA: Diagnosis not present

## 2022-06-13 DIAGNOSIS — R0789 Other chest pain: Secondary | ICD-10-CM | POA: Diagnosis present

## 2022-06-13 LAB — BASIC METABOLIC PANEL
Anion gap: 12 (ref 5–15)
BUN: 9 mg/dL (ref 6–20)
CO2: 22 mmol/L (ref 22–32)
Calcium: 9 mg/dL (ref 8.9–10.3)
Chloride: 99 mmol/L (ref 98–111)
Creatinine, Ser: 0.93 mg/dL (ref 0.44–1.00)
GFR, Estimated: >60 mL/min (ref >60)
Glucose, Bld: 94 mg/dL (ref 70–99)
Potassium: 3.5 mmol/L (ref 3.5–5.1)
Sodium: 133 mmol/L — ABNORMAL LOW (ref 135–145)

## 2022-06-13 LAB — I-STAT BETA HCG BLOOD, ED (MC, WL, AP ONLY): I-stat hCG, quantitative: <5 m[IU]/mL (ref <5)

## 2022-06-13 LAB — D-DIMER, QUANTITATIVE: D-Dimer, Quant: 0.32 ug/mL-FEU (ref 0.00–0.50)

## 2022-06-13 LAB — TROPONIN I (HIGH SENSITIVITY): Troponin I (High Sensitivity): 2 ng/L (ref <18)

## 2022-06-13 LAB — CBC
HCT: 39.5 % (ref 36.0–46.0)
Hemoglobin: 12.6 g/dL (ref 12.0–15.0)
MCH: 27.3 pg (ref 26.0–34.0)
MCHC: 31.9 g/dL (ref 30.0–36.0)
MCV: 85.7 fL (ref 80.0–100.0)
Platelets: 328 10*3/uL (ref 150–400)
RBC: 4.61 MIL/uL (ref 3.87–5.11)
RDW: 13.9 % (ref 11.5–15.5)
WBC: 12.6 10*3/uL — ABNORMAL HIGH (ref 4.0–10.5)
nRBC: 0 % (ref 0.0–0.2)

## 2022-06-13 MED ORDER — ACETAMINOPHEN 500 MG PO TABS
1000.0000 mg | ORAL_TABLET | Freq: Once | ORAL | Status: AC
  Administered 2022-06-13: 1000 mg via ORAL
  Filled 2022-06-13: qty 2

## 2022-06-13 MED ORDER — HYDROMORPHONE HCL 1 MG/ML IJ SOLN
0.5000 mg | Freq: Once | INTRAMUSCULAR | Status: AC
  Administered 2022-06-13: 0.5 mg via INTRAVENOUS
  Filled 2022-06-13: qty 1

## 2022-06-13 MED ORDER — FAMOTIDINE 20 MG PO TABS
20.0000 mg | ORAL_TABLET | Freq: Once | ORAL | Status: AC
  Administered 2022-06-13: 20 mg via ORAL
  Filled 2022-06-13: qty 1

## 2022-06-13 MED ORDER — ONDANSETRON HCL 4 MG/2ML IJ SOLN
4.0000 mg | Freq: Once | INTRAMUSCULAR | Status: AC
  Administered 2022-06-13: 4 mg via INTRAVENOUS
  Filled 2022-06-13: qty 2

## 2022-06-13 MED ORDER — ALUM & MAG HYDROXIDE-SIMETH 200-200-20 MG/5ML PO SUSP
30.0000 mL | Freq: Once | ORAL | Status: AC
  Administered 2022-06-13: 30 mL via ORAL
  Filled 2022-06-13: qty 30

## 2022-06-13 NOTE — ED Provider Notes (Signed)
[facility] AT [facility] Provider Note   CSN: [id number] Arrival date & time: 06/13/22  1038     History  Chief Complaint  Patient presents with   Chest Pain    Theresa Holt is a 37 y.o. female.  Pt with c/o mid to left chest pain for past 1-2 days. Symptoms at rest, constant, dull, occasionally worse w deep breath. Denies hx cad or fam hx cad. No hx dvt or pe. No leg pain or swelling. No recent surgery, immobility, or trauma. No cough or uri symptoms. No hemoptysis. No fever or chills. No chest wall injury or strain. No heartburn.   The history is provided by the patient and medical records.  Chest Pain Associated symptoms: no abdominal pain, no back pain, no cough, no fever, no headache, no palpitations and no shortness of breath        Home Medications Prior to Admission medications   Medication Sig Start Date End Date Taking? Authorizing Provider  letrozole (FEMARA) 2.5 MG tablet Take 2.5 mg by mouth daily. Patient not taking: Reported on 10/08/2020    [provider]      Allergies    Codeine    Review of Systems   Review of Systems  Constitutional:  Negative for chills and fever.  HENT:  Negative for sore throat.   Eyes:  Negative for redness.  Respiratory:  Negative for cough and shortness of breath.   Cardiovascular:  Positive for chest pain. Negative for palpitations and leg swelling.  Gastrointestinal:  Negative for abdominal pain.  Genitourinary:  Negative for flank pain.  Musculoskeletal:  Negative for back pain and neck pain.  Skin:  Negative for rash.  Neurological:  Negative for headaches.  Hematological:  Does not bruise/bleed easily.  Psychiatric/Behavioral:  Negative for confusion.     Physical Exam Updated Vital Signs BP (!) 121/94   Pulse 85   Temp 98.3 F (36.8 C) (Oral)   Resp 16   Ht 1.626 m (5\' 4" )   Wt 99.3 kg   SpO2 100%   BMI 37.58 kg/m  Physical Exam Vitals and nursing note reviewed.   Constitutional:      Appearance: Normal appearance. She is well-developed.  HENT:     Head: Atraumatic.     Nose: Nose normal.     Mouth/Throat:     Mouth: Mucous membranes are moist.  Eyes:     General: No scleral icterus.    Conjunctiva/sclera: Conjunctivae normal.  Neck:     Trachea: No tracheal deviation.  Cardiovascular:     Rate and Rhythm: Normal rate and regular rhythm.     Pulses: Normal pulses.     Heart sounds: Normal heart sounds. No murmur heard.    No friction rub. No gallop.  Pulmonary:     Effort: Pulmonary effort is normal. No respiratory distress.     Breath sounds: Normal breath sounds.  Chest:     Chest wall: No tenderness.  Abdominal:     General: Bowel sounds are normal. There is no distension.     Palpations: Abdomen is soft.     Tenderness: There is no abdominal tenderness. There is no guarding.  Genitourinary:    Comments: No cva tenderness.  Musculoskeletal:        General: No swelling or tenderness.     Cervical back: Normal range of motion and neck supple. No rigidity. No muscular tenderness.     Right lower leg: No edema.  Left lower leg: No edema.  Skin:    General: Skin is warm and dry.     Findings: No rash.  Neurological:     Mental Status: She is alert.     Comments: Alert, speech normal.   Psychiatric:        Mood and Affect: Mood normal.     ED Results / Procedures / Treatments   Labs (all labs ordered are listed, but only abnormal results are displayed) Results for orders placed or performed during the hospital encounter of 06/13/22  Basic metabolic panel  Result Value Ref Range   Sodium 133 (L) 135 - 145 mmol/L   Potassium 3.5 3.5 - 5.1 mmol/L   Chloride 99 98 - 111 mmol/L   CO2 22 22 - 32 mmol/L   Glucose, Bld 94 70 - 99 mg/dL   BUN 9 6 - 20 mg/dL   Creatinine, Ser 0.45 0.44 - 1.00 mg/dL   Calcium 9.0 8.9 - 40.9 mg/dL   GFR, Estimated >81 >19 mL/min   Anion gap 12 5 - 15  CBC  Result Value Ref Range   WBC 12.6  (H) 4.0 - 10.5 K/uL   RBC 4.61 3.87 - 5.11 MIL/uL   Hemoglobin 12.6 12.0 - 15.0 g/dL   HCT 14.7 82.9 - 56.2 %   MCV 85.7 80.0 - 100.0 fL   MCH 27.3 26.0 - 34.0 pg   MCHC 31.9 30.0 - 36.0 g/dL   RDW 13.0 86.5 - 78.4 %   Platelets 328 150 - 400 K/uL   nRBC 0.0 0.0 - 0.2 %  D-dimer, quantitative  Result Value Ref Range   D-Dimer, Quant 0.32 0.00 - 0.50 ug/mL-FEU  I-Stat beta hCG blood, ED  Result Value Ref Range   I-stat hCG, quantitative <5.0 <5 mIU/mL   Comment 3          Troponin I (High Sensitivity)  Result Value Ref Range   Troponin I (High Sensitivity) 2 <18 ng/L      EKG EKG Interpretation  Date/Time:  Tuesday Jun 13 2022 10:53:02 EDT Ventricular Rate:  77 PR Interval:  142 QRS Duration: 91 QT Interval:  390 QTC Calculation: 442 R Axis:   8 Text Interpretation: Sinus rhythm Confirmed by Cathren Laine ([id number]) on 06/13/2022 11:00:21 AM  Radiology DG Chest 2 View  Result Date: 06/13/2022 CLINICAL DATA:  Chest pain EXAM: CHEST - 2 VIEW COMPARISON:  04/11/2020 FINDINGS: The heart size and mediastinal contours are within normal limits. Both lungs are clear. The visualized skeletal structures are unremarkable. IMPRESSION: No active cardiopulmonary disease. Electronically Signed   By: Judie Petit.  Shick M.D.   On: 06/13/2022 11:50    Procedures Procedures    Medications Ordered in ED Medications  famotidine (PEPCID) tablet 20 mg (has no administration in time range)  alum & mag hydroxide-simeth (MAALOX/MYLANTA) 200-200-20 MG/5ML suspension 30 mL (has no administration in time range)  acetaminophen (TYLENOL) tablet 1,000 mg (has no administration in time range)  HYDROmorphone (DILAUDID) injection 0.5 mg (0.5 mg Intravenous Given 06/13/22 1145)  ondansetron (ZOFRAN) injection 4 mg (4 mg Intravenous Given 06/13/22 1145)    ED Course/ Medical Decision Making/ A&P                             Medical Decision Making Problems Addressed: Precordial chest pain: acute illness or injury  with systemic symptoms that poses a threat to life or bodily functions  Amount and/or Complexity  of Data Reviewed External Data Reviewed: notes. Labs: ordered. Decision-making details documented in ED Course. Radiology: ordered and independent interpretation performed. Decision-making details documented in ED Course. ECG/medicine tests: ordered and independent interpretation performed. Decision-making details documented in ED Course.  Risk OTC drugs. Prescription drug management. Decision regarding hospitalization.   Iv ns. Continuous pulse ox and cardiac monitoring. Labs ordered/sent. Imaging ordered.   Differential diagnosis includes ACS, MSK CP, GI CP, etc. Dispo decision including potential need for admission considered - will get labs and imaging and reassess.   Reviewed nursing notes and prior charts for additional history. External reports reviewed. Additional history from:  Cardiac monitor: sinus rhythm, rate 84.  Pain meds for symptom relief.  Labs reviewed/interpreted by me - chem unremarkable. Hgb normal. Trop is normal - after symptoms present/constant, for past day, felt not c/w acs. Ddimer normal.   Xrays reviewed/interpreted by me - no pna.   Recheck, no chest pain or sob. No increased wob. Vitals normal. Pulse ox 100%.    Pt currently appears stable for d/c.  Rec close pcp/cardiology f/u.  Return precautions provided.            Final Clinical Impression(s) / ED Diagnoses Final diagnoses:  None    Rx / DC Orders ED Discharge Orders     None         Cathren Laine, MD 06/13/22 1341

## 2022-06-13 NOTE — Discharge Instructions (Addendum)
It was our pleasure to provide your ER care today - we hope that you feel better.  Take acetaminophen or ibuprofen as need.   For recent chest pain, follow up closely with cardiologist in the coming week.   Return to ER if worse, new symptoms, fevers, recurrent or persistent chest pain, trouble breathing, new/severe pain, severe abdominal pain, or other emergency concern.   You were given pain meds in the ER - no driving for the next 6 hours.

## 2022-06-13 NOTE — ED Triage Notes (Signed)
Pt c/o left sided chest pain that radiates to left upper backx1.5d. Pt c/o nausea and SOB.

## 2022-08-03 ENCOUNTER — Institutional Professional Consult (permissible substitution): Payer: Medicaid Other | Admitting: Plastic Surgery

## 2022-08-07 ENCOUNTER — Ambulatory Visit: Payer: Medicaid Other | Admitting: Plastic Surgery

## 2022-08-07 VITALS — BP 122/89 | HR 80 | Ht 65.0 in | Wt 230.8 lb

## 2022-08-07 DIAGNOSIS — M542 Cervicalgia: Secondary | ICD-10-CM | POA: Diagnosis not present

## 2022-08-07 DIAGNOSIS — M546 Pain in thoracic spine: Secondary | ICD-10-CM | POA: Diagnosis not present

## 2022-08-07 DIAGNOSIS — N62 Hypertrophy of breast: Secondary | ICD-10-CM | POA: Diagnosis not present

## 2022-08-07 DIAGNOSIS — Z6838 Body mass index (BMI) 38.0-38.9, adult: Secondary | ICD-10-CM

## 2022-08-08 ENCOUNTER — Encounter: Payer: Self-pay | Admitting: Interventional Cardiology

## 2022-08-08 ENCOUNTER — Ambulatory Visit: Payer: Medicaid Other | Attending: Interventional Cardiology | Admitting: Interventional Cardiology

## 2022-08-14 ENCOUNTER — Encounter: Payer: Self-pay | Admitting: Plastic Surgery

## 2022-08-14 NOTE — Progress Notes (Signed)
   Referring Provider Carmel Sacramento, NP [address] [place],  [place] [address]   CC:  Chief Complaint  Patient presents with   Advice Only      Theresa Holt is an 37 y.o. female.  HPI: Theresa Holt is a 37 year old female who presents today with complaints of upper back and neck pain shoulder grooving due to the large size of her breast.  She states her breasts have been large for many years and she has previously considered a breast reduction but feels that now is the appropriate time for her.  Allergies  Allergen Reactions   Codeine Itching    Outpatient Encounter Medications as of 08/07/2022  Medication Sig   letrozole (FEMARA) 2.5 MG tablet Take 2.5 mg by mouth daily.   No facility-administered encounter medications on file as of 08/07/2022.     Past Medical History:  Diagnosis Date   Chest pain    Lower abdominal pain     Past Surgical History:  Procedure Laterality Date   CESAREAN SECTION     TUBAL LIGATION     tubal reversal      No family history on file.  Social History   Social History Narrative   Not on file     Review of Systems General: Denies fevers, chills, weight loss CV: Denies chest pain, shortness of breath, palpitations Breast: Patient denies any specific breast complaints other than the large size of her breasts.  Physical Exam    08/07/2022    2:16 PM 06/13/2022    2:07 PM 06/13/2022   10:48 AM  Vitals with BMI  Height 5\' 5"     Weight 230 lbs 13 oz    BMI 38.41    Systolic 122 116 657  Diastolic 89 77 94  Pulse 80 68 85    General:  No acute distress,  Alert and oriented, Non-Toxic, Normal speech and affect Breast: Patient has large pendulous breast with grade 3 ptosis.  There are no obvious dominant masses on exam and the nipples are normal though medially placed in appearance the sternal notch to nipple distance on the right is 35 cm and 35 cm on the left the nipple to fold distance on the right is 21 cm and 21 cm on the  left. 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  We discussed the procedure at length including the location of the incisions and the unpredictable nature of scarring.  We discussed the risk of bleeding, infection, seroma formation.  We discussed the specific risk of nipple loss to nipple due to nipple ischemia.  I also discussed with the patient that because of her medially placed nipples that they may still appear medial postoperatively.  She understands I will use drains postoperatively.  She understands that she will need to wear compressive supportive garment for 6 weeks postoperatively.  Her postoperative restrictions include no heavy lifting greater than 20 pounds, no vigorous activity, no submerging the incisions in water for 6 weeks.  We did discuss the importance of early ambulation to prevent DVT postoperatively.  All questions were answered to her satisfaction.  Photographs were obtained today with her consent.  Will schedule for breast reduction at her request.  Theresa Holt 08/14/2022, 11:21 AM

## 2022-08-22 ENCOUNTER — Ambulatory Visit (HOSPITAL_COMMUNITY)
Admission: EM | Admit: 2022-08-22 | Discharge: 2022-08-22 | Disposition: A | Discharge status: Home / Self Care | Payer: Medicaid Other | Attending: Psychiatry | Admitting: Psychiatry

## 2022-08-22 DIAGNOSIS — G479 Sleep disorder, unspecified: Secondary | ICD-10-CM | POA: Insufficient documentation

## 2022-08-22 DIAGNOSIS — F419 Anxiety disorder, unspecified: Secondary | ICD-10-CM | POA: Insufficient documentation

## 2022-08-22 DIAGNOSIS — F322 Major depressive disorder, single episode, severe without psychotic features: Secondary | ICD-10-CM | POA: Insufficient documentation

## 2022-08-22 DIAGNOSIS — R450 Nervousness: Secondary | ICD-10-CM | POA: Insufficient documentation

## 2022-08-22 MED ORDER — HYDROXYZINE HCL 25 MG PO TABS: 25.0000 mg | ORAL_TABLET | Freq: Three times a day (TID) | ORAL | 0 refills | Status: DC | PRN

## 2022-08-22 NOTE — ED Provider Notes (Signed)
Behavioral Health Urgent Care Medical Screening Exam  Patient Name: Theresa Holt MRN: [id number] Date of Evaluation: 08/22/22 Chief Complaint:  Seeking mental health evaluation and requesting to be restarted on medications for depression and anxiety Diagnosis:  Final diagnoses:  MDD (major depressive disorder), severe (HCC)    History of Present illness: Theresa Holt is a 37 y.o. female patient presented to [facility] as a walk in unaccompanied seeking mental health evaluation and requesting to be restarted on medications for depression and anxiety  Theresa Holt, 37 y.o., female patient seen face to face by this providerand chart reviewed on 08/22/22.  Patient reports that she has been diagnosed with bipolar disorder, depression, and anxiety.  Reports she has been prescribed Seroquel and hydroxyzine in the past by [facility].  She is currently not taking any medications.  She has no psychiatric services in place.  She lives in a residence with her 2 children.  She denies any previous psychiatric admissions or suicide attempts.  She denies any substance use.  She works full-time at the [employer].  On evaluation Theresa Holt reports she has been dealing with depression and anxiety over the past year.  Reports over the past few months her depression and anxiety have increased.  She is interested in restarting medications for mood, depression, and anxiety.  During evaluation Theresa Holt is sitting in the assessment room in no acute distress.  She is fairly groomed and makes good eye contact.  She is alert/oriented x 4, cooperative, and attentive.  Her thought process is logical.  She has normal speech and behavior.  She endorses anxiety and depression with crying spells, spurts of happiness, increased agitation, isolation, decreased appetite decreased sleep, and overall unstable mood.  Reports difficulty sleeping due to racing thoughts.  She has a euthymic affect.   She does appear a bit anxious.  She identifies her stressors/triggers as her job, and being alone.  Her job is physically demanding.  She was in a long-term relationship but he was arrested 2 years ago after he killed his sister.  This situation was traumatizing for patient.  She adamantly denies any suicidal ideations.  She verbally contracts for safety.  She does not have access to firearms/weapons.  She denies homicidal ideations.  She denies visual/auditory hallucinations.  She does not appear psychotic or manic. Objectively:  there is no evidence of psychosis/mania or delusional thinking.  She conversed coherently, with goal directed thoughts, and no distractibility, or pre-occupation.  Discussed outpatient psychiatric resources with [facility] on the second floor.  Provided open access walk-in hours.  Patient agrees to present in the a.m.  Prescription for hydroxyzine 25 mg 3 times daily as needed for anxiety sent to patient's pharmacy.    At this time Theresa Holt is educated and verbalizes understanding of mental health resources and other crisis services in the community.  She is instructed to call 911 and present to the nearest emergency room should she experience any suicidal/homicidal ideation, auditory/visual/hallucinations, or detrimental worsening of her  mental health condition.    Flowsheet Row ED from 08/22/2022 in [facility] ED from 06/13/2022 in [facility] Emergency Department at [facility] ED from 09/09/2021 in [facility] at [facility] RISK CATEGORY No Risk No Risk No Risk       Psychiatric Specialty Exam  Presentation  General Appearance:No data recorded Eye Contact:No data recorded Speech:No data recorded Speech Volume:No data recorded Handedness:No data recorded  Mood and Affect  Mood:No data recorded Affect:No data recorded  Thought Process  Thought Processes:No data recorded Descriptions of Associations:No data  recorded Orientation:No data recorded Thought Content:No data recorded   Hallucinations:No data recorded Ideas of Reference:No data recorded Suicidal Thoughts:No data recorded Homicidal Thoughts:No data recorded  Sensorium  Memory:No data recorded Judgment:No data recorded Insight:No data recorded  Executive Functions  Concentration:No data recorded Attention Span:No data recorded Recall:No data recorded Fund of Knowledge:No data recorded Language:No data recorded  Psychomotor Activity  Psychomotor Activity:No data recorded  Assets  Assets:No data recorded  Sleep  Sleep:No data recorded Number of hours: No data recorded  Physical Exam: Physical Exam Vitals and nursing note reviewed.  Constitutional:      General: She is not in acute distress.    Appearance: Normal appearance. She is not ill-appearing.  HENT:     Head: Normocephalic.  Eyes:     Pupils: Pupils are equal, round, and reactive to light.  Cardiovascular:     Rate and Rhythm: Normal rate.  Pulmonary:     Effort: Pulmonary effort is normal.  Musculoskeletal:        General: Normal range of motion.     Cervical back: Normal range of motion.  Skin:    General: Skin is warm and dry.     Coloration: Skin is not jaundiced or pale.  Neurological:     Mental Status: She is alert and oriented to person, place, and time.  Psychiatric:        Attention and Perception: Attention and perception normal.        Mood and Affect: Affect normal. Mood is anxious and depressed.        Speech: Speech normal.        Behavior: Behavior is cooperative.        Thought Content: Thought content normal.        Cognition and Memory: Cognition normal.        Judgment: Judgment normal.    Review of Systems  Constitutional: Negative.   HENT: Negative.    Eyes: Negative.   Respiratory: Negative.    Cardiovascular: Negative.   Musculoskeletal: Negative.   Skin: Negative.   Neurological: Negative.    Psychiatric/Behavioral:  Positive for depression. The patient is nervous/anxious.    Blood pressure (!) 120/94, pulse 77, temperature 98.6 F (37 C), temperature source Oral, resp. rate 18, SpO2 100%. There is no height or weight on file to calculate BMI.  Musculoskeletal: Strength & Muscle Tone: within normal limits Gait & Station: normal Patient leans: N/A   [facility] MSE Discharge Disposition for Follow up and Recommendations: Based on my evaluation the patient does not appear to have an emergency medical condition and can be discharged with resources and follow up care in outpatient services for Medication Management and Individual Therapy  Discharge patient  Patient provided outpatient psychiatric resources for medication management and therapy.  Patient agrees to present in the a.m. to an open access walk-in appointment with [facility]  Provided prescription for hydroxyzine 25 mg 3 times daily as needed for anxiety.   Ardis Hughs, NP 08/22/2022, 9:51 PM

## 2022-08-22 NOTE — Discharge Instructions (Addendum)
[facility] [address] [place], [place]  [address] [phone]  Psychiatry walk-in available Monday-Friday.   Hours:  8:000 AM to 11:00 AM.  We encourage patient to arrive at 7:15 AM because it is first come first serve and after a certain quote has been reach you will have to return another day the service is offered.  Therapy assessment walk-in available on Monday,  Wednesday, Thursday Hours 8:00 AM to 11:00 AM.   We encourage patient to arrive at 7:15 AM because it is first come first serve and after a certain quote has been reach you will have to return another day the service is offered.     Discharge recommendations:   Medications: Patient is to take medications as prescribed. The patient or patient's guardian is to contact a medical professional and/or outpatient provider to address any new side effects that develop. The patient or the patient's guardian should update outpatient providers of any new medications and/or medication changes.    Outpatient Follow up: Please review list of outpatient resources for psychiatry and counseling. Please follow up with your primary care provider for all medical related needs.    Therapy: We recommend that patient participate in individual therapy to address mental health concerns.   Atypical antipsychotics: If you are prescribed an atypical antipsychotic, it is recommended that your height, weight, BMI, blood pressure, fasting lipid panel, and fasting blood sugar be monitored by your outpatient providers.  Safety:   The following safety precautions should be taken:   No sharp objects. This includes scissors, razors, scrapers, and putty knives.   Chemicals should be removed and locked up.   Medications should be removed and locked up.   Weapons should be removed and locked up. This includes firearms, knives and instruments that can be used to cause injury.   The patient should abstain from use of illicit  substances/drugs and abuse of any medications.  If symptoms worsen or do not continue to improve or if the patient becomes actively suicidal or homicidal then it is recommended that the patient return to the closest hospital emergency department, the [facility], or call 911 for further evaluation and treatment. National Suicide Prevention Lifeline 1-800-SUICIDE or 4085447327.  About 988 988 offers 24/7 access to trained crisis counselors who can help people experiencing mental health-related distress. People can call or text 988 or chat 988lifeline.org for themselves or if they are worried about a loved one who may need crisis support.   Based on what you have shared, a list of resources for outpatient therapy and psychiatry is provided below to get you started back on treatment.  It is imperative that you follow through with treatment within 5-7 days from the day of discharge to prevent any further risk to your safety or mental well-being.  You are not limited to the list provided.  In case of an urgent crisis, you may contact the Mobile Crisis Unit with [facility] at [phone].        Outpatient Services for Therapy and Medication Management for [facility] [address], [place], [address] [phone] phone  New Patient Assessment/Therapy Walk-ins Monday and Wednesday: 8am until slots are full. Every 1st and 2nd Friday: 1pm - 5pm  NO ASSESSMENT/THERAPY WALK-INS ON TUESDAYS OR THURSDAYS  New Patient Psychiatry/Medication Management Walk-ins Monday-Friday: 8am-11am  For all walk-ins, we ask that you arrive by 7:30am because patient will be seen in the order of arrival.  Availability is limited; therefore, you may not be seen on the same day that you walk-in.  Our goal is to serve and meet the needs of our community to the best of our ability.   [facility] [address] [place], [place], [address] [phone] phone  [facility] [address] [place], [place], [address] [phone] phone [phone] phone ([address], Anthem/Elevance, [address], [address], [address], [address], Healthy [place], [place], [place], [address], [employer], [place], UHC, [employer], [place], Out of Network)  [employer] [employer], [place] [address] [place], [place], [address] [phone] phone ([place], Anthem/Elevance, [employer]I Options/Carelon, BCBS, [address], [place], [place], [place], [place], [employer], [place], [place], [place], [facility])  [employer] [facility] [address] [place], [place], [address] [phone] phone (Medicaid, ask about other insurance)  [facility] [address], [address] [place], [place], [address] [phone] phone [phone] fax ([address], [place] , [place], [place], [place] Health Choice, UHC, General Electric, Self-Pay)  [facility] [address] [place], [place], [address] [phone] phone ([address], Anthem/Elevance, [address], [address], [place], CSX Corporation, [place], [place], [place], [employer], [place], [place], [place], [facility])  [facility] - 6-8 MONTH WAIT FOR THERAPY; SOONER FOR MEDICATION MANAGEMENT [address], [address] [place], [place], [address] [phone] phone ([address], AmeriHealth [address] - [place], [address], [place], [place], Friday Health Plans, [address], BCBS Healthy [place], [place], [address], [place], [place], [place], [place], Tricare, UHC, [employer], [place])  [facility] [address], [address] [place], [place], [address] [phone] phone  [facility] [address], [address] [place], [place], [address] [phone] phone  [facility] [address] [place], [place], [address] [phone] phone  [facility] [place] - THERAPY ONLY [address], [place], [address] [phone] phone  [facility], [place] [address], [place], [address] [phone] phone  [facility] [address] [place], [place], [address] [phone] phone [phone] fax  [facility] [address] [place], [place], [address] [phone] phone [phone] fax  [facility] [phone] [address] [place], [place], [address] [phone] phone  Jovita Kussmaul [address] [place], [place], [address]  [phone] phone  [facility]  (Adults Only) [address] [place], [place], [address]  [phone] phone [phone] fax

## 2022-08-22 NOTE — ED Notes (Signed)
Patient discharged by Vernard Gambles, NP with written and verbal instructions. Resources provided.

## 2022-08-23 ENCOUNTER — Encounter (HOSPITAL_COMMUNITY): Payer: Self-pay | Admitting: Physician Assistant

## 2022-08-23 ENCOUNTER — Ambulatory Visit (INDEPENDENT_AMBULATORY_CARE_PROVIDER_SITE_OTHER): Payer: Medicaid Other | Admitting: Physician Assistant

## 2022-08-23 VITALS — BP 131/98 | HR 64 | Temp 98.3°F | Ht 65.0 in | Wt 228.0 lb

## 2022-08-23 DIAGNOSIS — F411 Generalized anxiety disorder: Secondary | ICD-10-CM | POA: Diagnosis not present

## 2022-08-23 DIAGNOSIS — F332 Major depressive disorder, recurrent severe without psychotic features: Secondary | ICD-10-CM

## 2022-08-23 MED ORDER — HYDROXYZINE HCL 10 MG PO TABS
10.0000 mg | ORAL_TABLET | Freq: Three times a day (TID) | ORAL | 2 refills | Status: DC | PRN
Start: 2022-08-23 — End: 2022-11-17

## 2022-08-23 MED ORDER — QUETIAPINE FUMARATE 50 MG PO TABS
50.0000 mg | ORAL_TABLET | Freq: Every day | ORAL | 2 refills | Status: DC
Start: 2022-08-23 — End: 2022-11-17

## 2022-08-23 NOTE — Progress Notes (Signed)
Psychiatric Initial Adult Assessment   Patient Identification: Theresa Holt MRN:  [id number] Date of Evaluation:  08/23/2022 Referral Source: Referred by [facility] Chief Complaint:   Chief Complaint  Patient presents with   Establish Care   Medication Management   Visit Diagnosis:    ICD-10-CM   1. Generalized anxiety disorder  F41.1 QUEtiapine (SEROQUEL) 50 MG tablet    hydrOXYzine (ATARAX) 10 MG tablet    2. Severe episode of recurrent major depressive disorder, without psychotic features (HCC)  F33.2 QUEtiapine (SEROQUEL) 50 MG tablet      History of Present Illness:    Theresa Holt is a 37 year old female who has been assessed for bipolar disorder in the past who presents to [facility] to establish psychiatric care for medication management.  Patient was referred to this facility by [facility].  Patient presents today encounter for the management of her mood.  Patient reports that she has been experiencing episodes where she is extremely happy then becomes extremely sad the next moment.  During these episodes of sadness, patient experiences crying spells.  Patient has been experiencing these changes in her mood for a year and a half but her symptoms have been worsening for the last 3 months.  Patient rates her depression at 9 out of 10 with 10 being most severe.  Patient endorses depressive episodes every day with symptoms lasting half a day.  Patient endorses the following depressive symptoms: low mood, lack of energy, lack of motivation, decreased concentration, irritability, self-isolation, fluctuations in her sleep (patient either does not sleep or sleeps way too much), feelings of guilt/worthlessness, and hopelessness.  Patient states that her depressive symptoms are worsened by having to deal with her youngest daughter.  She reports that her youngest daughter  is also dealing with anxiety and symptoms of ODD.  Patient denies any alleviating factors to her depressive symptoms.  In addition to depression, patient endorses anxiety all the time.  Patient states that when her anxiety gets too bad, she tries to utilize breathing techniques to bring her anxiety back down.  Patient rates her anxiety an 8 out of 10.  Patient's current stressors include maintaining a job, her kids, and trying to find a better living situation for her kids.  Patient also endorses panic attacks that often occur after her crying spells.  Patient endorses the following symptoms related to her panic attacks: hyperventilation with shortness of breath, elevated heart rate, and difficulty breathing.  Patient reports that she has utilized Seroquel in the past.  She states that she last used Seroquel roughly a year ago when she was being treated for her bipolar symptoms.  Patient states that the medication was helpful but had to stop taking the medication due to being unable to make it to her psychiatric appointments and due to the medication causing her drowsiness.  A PHQ-9 screen was performed with the patient scoring a 23.  A GAD-7 screen was also performed with the patient scoring a 21.  Patient reports that she has been assessed for bipolar disorder but is unsure if she ever received an official diagnosis for bipolar disorder.  Patient denies a past history of hospitalization due to mental health.  Patient further denies a past history of suicide attempt.  Patient is alert and oriented x 4, calm, cooperative, and fully engaged in conversation during the encounter.  Patient reports that her mood is not the best at  this time stating that she had an episode of crying spells the night before and she is still recovering from that incident.  Patient denies suicidal or homicidal ideations.  She further denies auditory or visual hallucinations and does not appear to be responding to internal/external  stimuli.  Patient denies paranoia or delusional thoughts.  Patient endorses poor sleep and states that she receives roughly an hour sleep the night before.  Patient denies eating full meals and states that she mainly engages in snacking.  Patient endorses alcohol consumption sparingly and may have 1 drink a month.  Patient denies tobacco use.  Patient denies illicit drug use.  Associated Signs/Symptoms: Depression Symptoms:  depressed mood, anhedonia, insomnia, hypersomnia, psychomotor agitation, psychomotor retardation, fatigue, feelings of worthlessness/guilt, difficulty concentrating, hopelessness, impaired memory, anxiety, panic attacks, loss of energy/fatigue, disturbed sleep, weight loss, decreased labido, decreased appetite, (Hypo) Manic Symptoms:  Distractibility, Flight of Ideas, Licensed conveyancer, Impulsivity, Irritable Mood, Labiality of Mood, Anxiety Symptoms:  Excessive Worry, Panic Symptoms, Obsessive Compulsive Symptoms:   Checking,, Specific Phobias, Psychotic Symptoms:   Patient denies PTSD Symptoms: Had a traumatic exposure:  Patient denies traumatic experiences. Had a traumatic exposure in the last month:  N/A Re-experiencing:  None Hypervigilance:  No Hyperarousal:  None Avoidance:  Decreased Interest/Participation Foreshortened Future  Past Psychiatric History:  Patient reports that she has been assessed for bipolar disorder in the past  Patient denies a past history of hospitalization due to mental health  Patient denies a past history of suicide attempts  Patient denies a past history of homicide attempts  Previous Psychotropic Medications: Yes .  Patient reports that she has been placed on Seroquel and hydroxyzine in the past.  Patient also reports that she has been on Lexapro, Prozac, and Zoloft in the past.  Patient reports that she was on these medications after giving birth.  Substance Abuse History in the last 12 months:   No.  Consequences of Substance Abuse: Negative  Past Medical History:  Past Medical History:  Diagnosis Date   Chest pain    Lower abdominal pain     Past Surgical History:  Procedure Laterality Date   CESAREAN SECTION     TUBAL LIGATION     tubal reversal      Family Psychiatric History:  Patient is unsure of family history of psychiatric illness  Family history of suicide attempts: Patient denies Family history of homicide attempts: Patient denies Family history of substance abuse: Patient believes that alcoholism or drug abuse may occur on her mother's side of the family  Family History: History reviewed. No pertinent family history.  Social History:   Social History   Socioeconomic History   Marital status: Single    Spouse name: Not on file   Number of children: Not on file   Years of education: Not on file   Highest education level: Not on file  Occupational History   Not on file  Tobacco Use   Smoking status: Never   Smokeless tobacco: Never  Substance and Sexual Activity   Alcohol use: No   Drug use: No   Sexual activity: Yes    Birth control/protection: Surgical  Other Topics Concern   Not on file  Social History Narrative   Not on file   Social Determinants of Health   Financial Resource Strain: Not on file  Food Insecurity: Not on file  Transportation Needs: Not on file  Physical Activity: Inactive (01/11/2021)   Received from [facility], [facility]  Care   Exercise Vital Sign    Days of Exercise per Week: 0 days    Minutes of Exercise per Session: 0 min  Stress: Stress Concern Present (01/11/2021)   Received from [facility], [facility] - Occupational Stress Questionnaire    Feeling of Stress : Very much  Social Connections: Unknown (06/21/2021)   Received from [facility]   Social Network    Social Network: Not on file    Additional Social History:  Patient denies having social  support.  Patient endorses having children of her own.  Patient endorses housing at this time.  Patient is currently employed.  Patient denies a past history of military experience.  Patient denies a past history of prison or jail time.  Highest education earned by the patient is 12th grade.  Patient denies access to weapons.  Allergies:   Allergies  Allergen Reactions   Codeine Itching    Metabolic Disorder Labs: No results found for: "HGBA1C", "MPG" No results found for: "PROLACTIN" No results found for: "CHOL", "TRIG", "HDL", "CHOLHDL", "VLDL", "LDLCALC" No results found for: "TSH"  Therapeutic Level Labs: No results found for: "LITHIUM" No results found for: "CBMZ" No results found for: "VALPROATE"  Current Medications: Current Outpatient Medications  Medication Sig Dispense Refill   QUEtiapine (SEROQUEL) 50 MG tablet Take 1 tablet (50 mg total) by mouth at bedtime. 30 tablet 2   hydrOXYzine (ATARAX) 10 MG tablet Take 1 tablet (10 mg total) by mouth 3 (three) times daily as needed. 75 tablet 2   letrozole (FEMARA) 2.5 MG tablet Take 2.5 mg by mouth daily.     No current facility-administered medications for this visit.    Musculoskeletal: Strength & Muscle Tone: within normal limits Gait & Station: normal Patient leans: N/A  Psychiatric Specialty Exam: Review of Systems  Psychiatric/Behavioral:  Positive for dysphoric mood and sleep disturbance. Negative for decreased concentration, hallucinations, self-injury and suicidal ideas. The patient is nervous/anxious. The patient is not hyperactive.     Blood pressure (!) 131/98, pulse 64, temperature 98.3 F (36.8 C), temperature source Oral, height 5\' 5"  (1.651 m), weight 228 lb (103.4 kg), SpO2 100%.Body mass index is 37.94 kg/m.  General Appearance: Casual  Eye Contact:  Good  Speech:  Clear and Coherent and Normal Rate  Volume:  Normal  Mood:  Anxious and Depressed  Affect:  Congruent  Thought Process:  Coherent, Goal  Directed, and Descriptions of Associations: Intact  Orientation:  Full (Time, Place, and Person)  Thought Content:  WDL  Suicidal Thoughts:  No  Homicidal Thoughts:  No  Memory:  Immediate;   Good Recent;   Good Remote;   Fair  Judgement:  Good  Insight:  Good  Psychomotor Activity:  Normal  Concentration:  Concentration: Good and Attention Span: Good  Recall:  Good  Fund of Knowledge:Good  Language: Good  Akathisia:  No  Handed:  Right  AIMS (if indicated):  not done  Assets:  Communication Skills Desire for Improvement Housing Social Support Transportation Vocational/Educational  ADL's:  Intact  Cognition: WNL  Sleep:  Poor   Screenings: GAD-7    Loss adjuster, chartered Office Visit from 08/23/2022 in [facility]  Total GAD-7 Score 21      PHQ2-9    Flowsheet Row Office Visit from 08/23/2022 in [facility]  PHQ-2 Total Score 6  PHQ-9 Total Score 23      Flowsheet  Row Office Visit from 08/23/2022 in [facility] ED from 08/22/2022 in [facility] ED from 06/13/2022 in [facility] Department at [facility]  C-SSRS RISK CATEGORY No Risk No Risk No Risk       Assessment and Plan:   Theresa Holt is a 37 year old female who has been assessed for bipolar disorder in the past who presents to [facility] to establish psychiatric care for medication management.  Patient presents to the encounter for the management of her depressive symptoms, anxiety, and panic attacks.  Patient has been assessed for symptoms of bipolar disorder in the past and has been on Seroquel and hydroxyzine for the management of her symptoms.  When patient was being prescribed Seroquel, she reports that the medication was helpful in managing her mood; however, she experiences drowsiness while on the medication and was not able to continue with  the medication due to not being able to make it to all of her psychiatric appointments.  Patient reports that she has also been on the following psychiatric medications: Zoloft, Prozac, and Lexapro.  Patient presents to the encounter dealing with ongoing depressive symptoms that she has been struggling with for roughly a year and a half.  Patient endorses anxiety all the time accompanied by panic attacks that often occur after her crying spells.  Patient is interested in being placed back on Seroquel since the medication was effective in managing her symptoms before.  Patient to be placed on Seroquel 50 mg at bedtime for the management of her depressive symptoms, anxiety, and for mood stability.  Patient was also recommended hydroxyzine 10 mg 3 times daily as needed for the management of her anxiety.  Patient was agreeable to recommendations.  Patient's medications to be e-prescribed to pharmacy of choice.  Provider allowed for time at the end of the encounter to discuss potential adverse side effects to patient's current medication regimen.  Patient vocalized understanding.  Collaboration of Care: Medication Management AEB provider managing patient's psychiatric medications, Primary Care Provider AEB and being followed by a primary care provider, Psychiatrist AEB patient being followed by mental health provider at this facility, and Referral or follow-up with counselor/therapist AEB patient to be set up with a licensed medical social worker at this facility  Patient/Guardian was advised Release of Information must be obtained prior to any record release in order to collaborate their care with an outside provider. Patient/Guardian was advised if they have not already done so to contact the registration department to sign all necessary forms in order for [place] to release information regarding their care.   Consent: Patient/Guardian gives verbal consent for treatment and assignment of benefits for services  provided during this visit. Patient/Guardian expressed understanding and agreed to proceed.   1. Generalized anxiety disorder  - QUEtiapine (SEROQUEL) 50 MG tablet; Take 1 tablet (50 mg total) by mouth at bedtime.  Dispense: 30 tablet; Refill: 2 - hydrOXYzine (ATARAX) 10 MG tablet; Take 1 tablet (10 mg total) by mouth 3 (three) times daily as needed.  Dispense: 75 tablet; Refill: 2  2. Severe episode of recurrent major depressive disorder, without psychotic features (HCC)  - QUEtiapine (SEROQUEL) 50 MG tablet; Take 1 tablet (50 mg total) by mouth at bedtime.  Dispense: 30 tablet; Refill: 2  Patient to follow-up in 7 weeks Provider spent a total of 37 minutes with the patient/reviewing patient's chart  Meta Hatchet, PA 7/17/202410:34 AM

## 2022-09-08 ENCOUNTER — Telehealth: Payer: Self-pay | Admitting: Plastic Surgery

## 2022-09-08 NOTE — Telephone Encounter (Signed)
Patient called to get update on if her surgery has been sent off to insurance yet. I did look to see if surgery referral was put in yet but I didn't see it. Please advise

## 2022-09-28 NOTE — Progress Notes (Signed)
Patient connected for virtual psychiatric medication management appointment on 10/02/22 at 9AM  however reported she had started first day of school and would not be able to conduct visit today (patient had stepped outside of building to talk to this Clinical research associate). She requests to reschedule and will plan to call the clinic when she has her school schedule. Discussed that she can reschedule with previous provider, Otila Back PA, or myself. Denies any urgent questions or concerns. Reminded patient that she has 2 refills available on her most recently prescribed psychotropic medications.   Daine Gip, MD 10/02/22

## 2022-10-02 ENCOUNTER — Encounter (HOSPITAL_COMMUNITY): Payer: Medicaid Other | Admitting: Psychiatry

## 2022-10-18 ENCOUNTER — Ambulatory Visit (HOSPITAL_COMMUNITY): Payer: Medicaid Other | Admitting: Licensed Clinical Social Worker

## 2022-10-20 ENCOUNTER — Other Ambulatory Visit: Payer: Self-pay

## 2022-10-20 ENCOUNTER — Encounter (HOSPITAL_BASED_OUTPATIENT_CLINIC_OR_DEPARTMENT_OTHER): Payer: Self-pay | Admitting: Plastic Surgery

## 2022-10-21 ENCOUNTER — Ambulatory Visit
Admission: EM | Admit: 2022-10-21 | Discharge: 2022-10-21 | Disposition: A | Discharge status: Other Acute Inpatient Hospital | Payer: Medicaid Other | Attending: Physician Assistant | Admitting: Physician Assistant

## 2022-10-21 ENCOUNTER — Other Ambulatory Visit: Payer: Self-pay

## 2022-10-21 DIAGNOSIS — R0602 Shortness of breath: Secondary | ICD-10-CM | POA: Insufficient documentation

## 2022-10-21 DIAGNOSIS — R002 Palpitations: Secondary | ICD-10-CM | POA: Diagnosis not present

## 2022-10-21 DIAGNOSIS — R0789 Other chest pain: Secondary | ICD-10-CM | POA: Diagnosis not present

## 2022-10-21 NOTE — ED Triage Notes (Signed)
"  I woke up with a cough and it feel's like when I breath in that I have sob". My chest "feel's like palpitations and chest heaviness as well". No fever known. No runny nose. "All symptoms woke up with this morning". No nausea. No dizziness.

## 2022-10-21 NOTE — ED Provider Notes (Signed)
[facility]    CSN: [id number] Arrival date & time: 10/21/22  0808      History   Chief Complaint Chief Complaint  Patient presents with   Palpitations    HPI Theresa Holt is a 37 y.o. female.   Patient here today for evaluation of cough, substernal chest discomfort, and shortness of breath that started last night.  She states she woke up with a cough and states she has had palpitations and chest heaviness since that time.  She does have low-grade fever in office today but does not report fever outside of the office.  She has not had any runny nose.  She denies any nausea, vomiting or dizziness.  She has had mild headache.  She does not report treatment for symptoms.  The history is provided by the patient.  Palpitations Associated symptoms: cough and shortness of breath   Associated symptoms: no nausea and no vomiting     Past Medical History:  Diagnosis Date   Anxiety    Chest pain    Depression    Fibromyalgia    Lower abdominal pain     Patient Active Problem List   Diagnosis Date Noted   Sensorineural hearing loss (SNHL), bilateral 05/30/2021   Fertility testing 03/03/2016   Hyperlipidemia 07/28/2011   Headache 03/11/2010   Bronchospasm 02/10/2010   Obesity 12/13/2009   Allergic rhinitis 10/19/2009   Fracture of phalanx of finger 09/30/2009   Anxiety 09/24/2009   Fungal infection of foot 08/02/2009   Insomnia 08/02/2009   Need for HPV vaccination 08/02/2009   Obesity (BMI 30-39.9) 08/02/2009   Sorethroat 08/02/2009   Stress 08/02/2009   Pain in or around eye 10/15/2008   Depression 07/17/2007    Past Surgical History:  Procedure Laterality Date   CARPAL TUNNEL RELEASE     CESAREAN SECTION     TUBAL LIGATION     tubal reversal      OB History   No obstetric history on file.      Home Medications    Prior to Admission medications   Medication Sig Start Date End Date Taking? Authorizing Provider  albuterol (VENTOLIN HFA) 108  (90 Base) MCG/ACT inhaler Inhale 2 puffs into the lungs every 4 (four) hours as needed for wheezing or shortness of breath. 02/10/10  Yes [provider]  ARIPiprazole (ABILIFY) 5 MG tablet Take 5 mg by mouth daily. 05/28/21  Yes [provider]  atorvastatin (LIPITOR) 40 MG tablet Take 40 mg by mouth daily. 07/11/19  Yes [provider]  buPROPion (WELLBUTRIN XL) 300 MG 24 hr tablet Take 300 mg by mouth daily. 07/28/11  Yes [provider]  butalbital-acetaminophen-caffeine (FIORICET) 50-325-40 MG tablet Take 1 tablet by mouth every 6 (six) hours as needed for headache or migraine. 07/28/11  Yes [provider]  clindamycin (CLEOCIN) 300 MG capsule Take 300 mg by mouth every 8 (eight) hours. 05/16/22  Yes [provider]  clonazePAM (KLONOPIN) 0.5 MG tablet Take 0.5 mg by mouth as needed for anxiety. 07/28/11  Yes [provider]  diphenhydrAMINE (BENADRYL) 25 mg capsule Take 25 mg by mouth every 6 (six) hours as needed for itching, allergies or sleep. 03/24/16  Yes [provider]  doxycycline (ADOXA) 100 MG tablet Take 100 mg by mouth 2 (two) times daily. 05/13/22  Yes [provider]  DULoxetine (CYMBALTA) 60 MG capsule Take 1 capsule by mouth daily. 09/12/18  Yes [provider]  escitalopram (LEXAPRO) 20 MG tablet  Take 20 mg by mouth daily. 05/30/21  Yes [provider]  fluconazole (DIFLUCAN) 150 MG tablet Take 150 mg by mouth once. 05/25/22  Yes [provider]  fluticasone (FLONASE) 50 MCG/ACT nasal spray Place 1 spray into both nostrils daily. 05/16/10  Yes [provider]  gabapentin (NEURONTIN) 300 MG capsule Take 300 mg by mouth at bedtime. 07/08/19  Yes [provider]  human chorionic gonadotropin (PREGNYL/NOVAREL) 10000 units injection Inject 10,000 Units as directed once. 07/23/19  Yes [provider]  HYDROcodone-acetaminophen (NORCO) 10-325 MG tablet Take 1 tablet by  mouth every 6 (six) hours as needed for moderate pain.   Yes [provider]  HYDROcodone-acetaminophen (NORCO) 7.5-325 MG tablet Take 1 tablet by mouth 3 (three) times daily as needed. 06/22/22  Yes [provider]  HYDROmorphone (DILAUDID) 2 MG tablet Take 2 mg by mouth as directed. 11/12/20  Yes [provider]  hydrOXYzine (ATARAX) 10 MG tablet Take 1 tablet (10 mg total) by mouth 3 (three) times daily as needed. 08/23/22  Yes Nwoko, Tommas Olp, PA  hydrOXYzine (ATARAX) 25 MG tablet Take 25 mg by mouth every 8 (eight) hours as needed for anxiety. 11/20/13  Yes [provider]  ketorolac (TORADOL) 10 MG tablet Take 10 mg by mouth every 8 (eight) hours as needed for moderate pain or severe pain. 02/11/20  Yes [provider]  letrozole (FEMARA) 2.5 MG tablet Take 2.5 mg by mouth daily.   Yes [provider]  letrozole (FEMARA) 2.5 MG tablet Take 2.5 mg by mouth daily. 06/29/19  Yes [provider]  linaclotide Karlene Einstein) 290 MCG CAPS capsule Take 290 mcg by mouth daily before breakfast. 03/24/16  Yes [provider]  linaclotide (LINZESS) 72 MCG capsule Take 72 mcg by mouth daily before breakfast. 04/20/21  Yes [provider]  loratadine (CLARITIN) 10 MG tablet Take 10 mg by mouth daily as needed for allergies. 05/16/10  Yes [provider]  metFORMIN (GLUCOPHAGE) 500 MG tablet Take 500 mg by mouth daily with breakfast. 02/29/16  Yes [provider]  metroNIDAZOLE (FLAGYL) 500 MG tablet Take 500 mg by mouth 3 (three) times daily. 09/28/22  Yes [provider]  MILI 0.25-35 MG-MCG tablet Take 1 tablet by mouth daily. 05/12/22  Yes [provider]  NEEDLE, DISP, 30 G (BD DISP NEEDLES) 30G X 1/2" MISC 1 each by Misc.(Non-Drug; Combo Route) route daily. 07/23/19  Yes [provider]  NEEDLE, DISP, 30 G (BD DISP NEEDLES) 30G X 1/2" MISC 1 each by miscellaneous route Once Daily. 07/23/19  Yes  [provider]  omeprazole (PRILOSEC) 40 MG capsule Take 40 mg by mouth daily. 06/16/22  Yes [provider]  pantoprazole (PROTONIX) 40 MG tablet Take 40 mg by mouth daily. 08/07/18  Yes [provider]  QUEtiapine (SEROQUEL) 25 MG tablet Take 0.5-1 tablets by mouth at bedtime. 05/28/21  Yes [provider]  QUEtiapine (SEROQUEL) 50 MG tablet Take 1 tablet (50 mg total) by mouth at bedtime. 08/23/22  Yes Nwoko, Uchenna E, PA  sertraline (ZOLOFT) 50 MG tablet Take 50 mg by mouth daily. 07/28/11  Yes [provider]  SYRINGE-NEEDLE, DISP, 3 ML (B-D 3CC LUER-LOK SYR 18GX1-1/2) 18G X 1-1/2" 3 ML MISC 1 each by miscellaneous route Once Daily. 03/20/19  Yes [provider]  SYRINGE-NEEDLE, DISP, 3 ML (B-D 3CC LUER-LOK SYR 18GX1-1/2) 18G X 1-1/2" 3 ML MISC 3 Syringes by miscellaneous route Once Daily. 06/24/19  Yes [provider]  SYRINGE-NEEDLE, DISP, 3 ML (B-D 3CC LUER-LOK SYR 18GX1-1/2) 18G X 1-1/2" 3 ML MISC 1 each by Misc.(Non-Drug; Combo Route) route daily. 03/20/19  Yes [provider]  SYRINGE-NEEDLE, DISP, 3 ML (B-D 3CC LUER-LOK SYR 18GX1-1/2) 18G X 1-1/2" 3 ML MISC 3 Syringes by Misc.(Non-Drug; Combo Route) route daily. 06/24/19  Yes [provider]  SYRINGE-NEEDLE, DISP, 3 ML (EASY TOUCH FLIPLOCK SAFETY SYR) 18G X 1-1/2" 3 ML MISC 1 each by Misc.(Non-Drug; Combo Route) route daily. 07/23/19  Yes [provider]  SYRINGE-NEEDLE, DISP, 3 ML (EASY TOUCH FLIPLOCK SAFETY SYR) 18G X 1-1/2" 3 ML MISC 1 each by miscellaneous route Once Daily. 07/23/19  Yes [provider]  traMADol-acetaminophen (ULTRACET) 37.5-325 MG tablet  08/25/19  Yes [provider]  triamcinolone cream (KENALOG) 0.1 % Apply 1 Application topically as directed. 06/12/18  Yes [provider]  zolpidem (AMBIEN) 10 MG tablet Take 10 mg by mouth at bedtime as needed for sleep. 07/28/11  Yes [provider]  cyclobenzaprine  (FLEXERIL) 10 MG tablet Take 10 mg by mouth at bedtime.    [provider]  HYDROXYZINE HCL PO Take by mouth.    [provider]  metformin (FORTAMET) 500 MG (OSM) 24 hr tablet Take 500 mg by mouth daily with breakfast.    [provider]  methylPREDNISolone (MEDROL DOSEPAK) 4 MG TBPK tablet Take 4 mg by mouth See admin instructions. follow package directions    [provider]    Family History History reviewed. No pertinent family history.  Social History Social History   Tobacco Use   Smoking status: Never   Smokeless tobacco: Never  Vaping Use   Vaping status: Never Used  Substance Use Topics   Alcohol use: No   Drug use: No     Allergies   Codeine   Review of Systems Review of Systems  Constitutional:  Negative for chills and fever.  HENT:  Negative for congestion and rhinorrhea.   Eyes:  Negative for discharge and redness.  Respiratory:  Positive for cough, chest tightness and shortness of breath.   Cardiovascular:  Positive for palpitations.  Gastrointestinal:  Negative for abdominal pain, nausea and vomiting.     Physical Exam Triage Vital Signs ED Triage Vitals  Encounter Vitals Group     BP 10/21/22 0820 124/85     Systolic BP Percentile --      Diastolic BP Percentile --      Pulse Rate 10/21/22 0820 98     Resp 10/21/22 0820 20     Temp 10/21/22 0820 99.8 F (37.7 C)     Temp Source 10/21/22 0820 Oral     SpO2 10/21/22 0820 99 %     Weight 10/21/22 0830 231 lb (104.8 kg)     Height 10/21/22 0830 5\' 5"  (1.651 m)     Head Circumference --      Peak Flow --      Pain Score 10/21/22 0826 8     Pain Loc --      Pain Education --      Exclude from Growth Chart --    No data found.  Updated Vital Signs BP 124/85 (BP Location: Left Arm)   Pulse 96   Temp 99.8 F (37.7 C) (Oral)   Resp 20   Ht 5\' 5"  (1.651 m)   Wt 231 lb (104.8 kg)   LMP 09/28/2022 (Exact Date)   SpO2 97%   BMI 38.44 kg/m  Physical  Exam Vitals and nursing note reviewed.  Constitutional:      General: She is not in acute distress.    Appearance: Normal appearance. She is not ill-appearing.  HENT:     Head: Normocephalic and atraumatic.  Eyes:     Conjunctiva/sclera: Conjunctivae normal.  Cardiovascular:     Rate and Rhythm: Normal rate.  Pulmonary:     Effort: Pulmonary effort is normal. No respiratory distress.  Neurological:     Mental Status: She is alert.  Psychiatric:        Mood and Affect: Mood normal.        Behavior: Behavior normal.        Thought Content: Thought content normal.      UC Treatments / Results  Labs (all labs ordered are listed, but only abnormal results are displayed) Labs Reviewed - No data to display  EKG   Radiology No results found.  Procedures Procedures (including critical care time)  Medications Ordered in UC Medications - No data to display  Initial Impression / Assessment and Plan / UC Course  I have reviewed the triage vital signs and the nursing notes.  Pertinent labs & imaging results that were available during my care of the patient were reviewed by me and considered in my medical decision making (see chart for details).   When discussing symptoms patient reports that the palpitations feel different than prior palpitations in May.  She states that she does have some sternal chest discomfort and tightness but describes it more as a discomfort and less of the pain.  Recommended further evaluation in the emergency room as I suspect patient will need stat labs and imaging possible rule out PE versus viral illness versus bronchitis.  Patient will transport via POV.  She states she feels stable to do so.  Vitals are relatively stable other than low-grade fever.     Final Clinical Impressions(s) / UC Diagnoses   Final diagnoses:  Shortness of breath  Chest tightness  Palpitations   Discharge Instructions   None    ED Prescriptions   None    PDMP not  reviewed this encounter.   Tomi Bamberger, PA-C 10/21/22 [phone]

## 2022-10-21 NOTE — ED Notes (Signed)
Patient is being discharged from the Urgent Care and sent to the Emergency Department via POV . Per RM, patient is in need of higher level of care due to palpitations. Patient is aware and verbalizes understanding of plan of care.  Vitals:   10/21/22 0820 10/21/22 0832  BP: 124/85   Pulse: 98 96  Resp: 20 20  Temp: 99.8 F (37.7 C)   SpO2: 99% 97%

## 2022-10-23 ENCOUNTER — Ambulatory Visit (INDEPENDENT_AMBULATORY_CARE_PROVIDER_SITE_OTHER): Payer: Medicaid Other | Admitting: Student

## 2022-10-23 ENCOUNTER — Encounter: Payer: Self-pay | Admitting: Student

## 2022-10-23 VITALS — BP 125/77 | HR 71 | Ht 65.0 in | Wt 229.6 lb

## 2022-10-23 DIAGNOSIS — N62 Hypertrophy of breast: Secondary | ICD-10-CM

## 2022-10-23 MED ORDER — ONDANSETRON HCL 4 MG PO TABS: 4.0000 mg | ORAL_TABLET | Freq: Three times a day (TID) | ORAL | 0 refills | Status: DC | PRN

## 2022-10-23 NOTE — H&P (View-Only) (Signed)
Patient ID: Theresa Holt, female    DOB: Jul 27, 1985, 37 y.o.   MRN: [id number]  No chief complaint on file.   No diagnosis found.   History of Present Illness: Theresa Stickle is a 37 y.o.  female  with a history of macromastia.  She presents for preoperative evaluation for upcoming procedure, Bilateral Breast Reduction, scheduled for 10/27/2022 with Dr.  Ladona Ridgel  The patient has not had problems with anesthesia.  Patient did state that she needed a mammogram about 2 to 3 years ago as ordered by her gynecologist, but it was negative and she did not need anymore mammograms from that point on.  Patient denies any personal or family history of breast cancer.  She denies any history of cardiac disease.  She denies taking any blood thinners.  Patient reports she is not a smoker.  Patient denies taking any birth control or hormone replacement.  She reports history of 1 ectopic pregnancy in 2021.  She denies any personal or family history of blood clots or clotting diseases.  She denies any recent traumas, surgeries or infections.  She denies any history of stroke or heart attack.  She denies any history of Crohn's disease or ulcerative colitis.  She denies any history of COPD or asthma.  She denies any history of cancer.  She denies any varicosities to her lower extremities.  She denies any recent fevers, chills or changes in her health.  Patient states that she is currently a 42G cup and would like to be a full C/D cup.  I discussed with the patient that cup size cannot be guaranteed.  Patient expressed understanding.  Summary of Previous Visit: Patient was seen for consult initially by Dr. Ladona Ridgel on 08/07/2022.  Patient at this visit reported she was experiencing back and neck pain attributed to her the large size of her breasts.  On exam, her breasts were large and pendulous with grade 3 ptosis.  Her STN on the right was 35 cm and her STN on the left was 35 cm.  Estimated excess breast tissue to be  removed at time of surgery: 900 grams  Job: Physicist, medical, planning to take 1 week off  PMH Significant for: Anxiety, hyperlipidemia, insomnia, macromastia  Patient states she currently sees a pain management provider, Carmel Sacramento at [employer] and receives [employer] from him.  Discussed with patient that she will have to reach out to them for postoperative pain management.  Discussed with her that she should reach out to them prior to surgery so she is covered for her after surgery.  Patient expressed understanding and states that she is going to call them today or tomorrow.   Past Medical History: Allergies: Allergies  Allergen Reactions   Codeine Itching    Current Medications:  Current Outpatient Medications:    albuterol (VENTOLIN HFA) 108 (90 Base) MCG/ACT inhaler, Inhale 2 puffs into the lungs every 4 (four) hours as needed for wheezing or shortness of breath., Disp: , Rfl:    ARIPiprazole (ABILIFY) 5 MG tablet, Take 5 mg by mouth daily., Disp: , Rfl:    atorvastatin (LIPITOR) 40 MG tablet, Take 40 mg by mouth daily., Disp: , Rfl:    buPROPion (WELLBUTRIN XL) 300 MG 24 hr tablet, Take 300 mg by mouth daily., Disp: , Rfl:    butalbital-acetaminophen-caffeine (FIORICET) 50-325-40 MG tablet, Take 1 tablet by mouth every 6 (six) hours as needed for headache or migraine., Disp: , Rfl:    clindamycin (CLEOCIN)  300 MG capsule, Take 300 mg by mouth every 8 (eight) hours., Disp: , Rfl:    clonazePAM (KLONOPIN) 0.5 MG tablet, Take 0.5 mg by mouth as needed for anxiety., Disp: , Rfl:    cyclobenzaprine (FLEXERIL) 10 MG tablet, Take 10 mg by mouth at bedtime., Disp: , Rfl:    diphenhydrAMINE (BENADRYL) 25 mg capsule, Take 25 mg by mouth every 6 (six) hours as needed for itching, allergies or sleep., Disp: , Rfl:    doxycycline (ADOXA) 100 MG tablet, Take 100 mg by mouth 2 (two) times daily., Disp: , Rfl:    DULoxetine (CYMBALTA) 60 MG capsule, Take 1 capsule by mouth daily., Disp: , Rfl:     escitalopram (LEXAPRO) 20 MG tablet, Take 20 mg by mouth daily., Disp: , Rfl:    fluconazole (DIFLUCAN) 150 MG tablet, Take 150 mg by mouth once., Disp: , Rfl:    fluticasone (FLONASE) 50 MCG/ACT nasal spray, Place 1 spray into both nostrils daily., Disp: , Rfl:    gabapentin (NEURONTIN) 300 MG capsule, Take 300 mg by mouth at bedtime., Disp: , Rfl:    human chorionic gonadotropin (PREGNYL/NOVAREL) 10000 units injection, Inject 10,000 Units as directed once., Disp: , Rfl:    HYDROcodone-acetaminophen (NORCO) 10-325 MG tablet, Take 1 tablet by mouth every 6 (six) hours as needed for moderate pain., Disp: , Rfl:    HYDROcodone-acetaminophen (NORCO) 7.5-325 MG tablet, Take 1 tablet by mouth 3 (three) times daily as needed., Disp: , Rfl:    HYDROmorphone (DILAUDID) 2 MG tablet, Take 2 mg by mouth as directed., Disp: , Rfl:    hydrOXYzine (ATARAX) 10 MG tablet, Take 1 tablet (10 mg total) by mouth 3 (three) times daily as needed., Disp: 75 tablet, Rfl: 2   hydrOXYzine (ATARAX) 25 MG tablet, Take 25 mg by mouth every 8 (eight) hours as needed for anxiety., Disp: , Rfl:    HYDROXYZINE HCL PO, Take by mouth., Disp: , Rfl:    ketorolac (TORADOL) 10 MG tablet, Take 10 mg by mouth every 8 (eight) hours as needed for moderate pain or severe pain., Disp: , Rfl:    letrozole (FEMARA) 2.5 MG tablet, Take 2.5 mg by mouth daily., Disp: , Rfl:    letrozole (FEMARA) 2.5 MG tablet, Take 2.5 mg by mouth daily., Disp: , Rfl:    linaclotide (LINZESS) 290 MCG CAPS capsule, Take 290 mcg by mouth daily before breakfast., Disp: , Rfl:    linaclotide (LINZESS) 72 MCG capsule, Take 72 mcg by mouth daily before breakfast., Disp: , Rfl:    loratadine (CLARITIN) 10 MG tablet, Take 10 mg by mouth daily as needed for allergies., Disp: , Rfl:    metformin (FORTAMET) 500 MG (OSM) 24 hr tablet, Take 500 mg by mouth daily with breakfast., Disp: , Rfl:    metFORMIN (GLUCOPHAGE) 500 MG tablet, Take 500 mg by mouth daily with  breakfast., Disp: , Rfl:    methylPREDNISolone (MEDROL DOSEPAK) 4 MG TBPK tablet, Take 4 mg by mouth See admin instructions. follow package directions, Disp: , Rfl:    metroNIDAZOLE (FLAGYL) 500 MG tablet, Take 500 mg by mouth 3 (three) times daily., Disp: , Rfl:    MILI 0.25-35 MG-MCG tablet, Take 1 tablet by mouth daily., Disp: , Rfl:    NEEDLE, DISP, 30 G (BD DISP NEEDLES) 30G X 1/2" MISC, 1 each by Misc.(Non-Drug; Combo Route) route daily., Disp: , Rfl:    NEEDLE, DISP, 30 G (BD DISP NEEDLES) 30G X 1/2" MISC, 1 each  by miscellaneous route Once Daily., Disp: , Rfl:    omeprazole (PRILOSEC) 40 MG capsule, Take 40 mg by mouth daily., Disp: , Rfl:    pantoprazole (PROTONIX) 40 MG tablet, Take 40 mg by mouth daily., Disp: , Rfl:    QUEtiapine (SEROQUEL) 25 MG tablet, Take 0.5-1 tablets by mouth at bedtime., Disp: , Rfl:    QUEtiapine (SEROQUEL) 50 MG tablet, Take 1 tablet (50 mg total) by mouth at bedtime., Disp: 30 tablet, Rfl: 2   sertraline (ZOLOFT) 50 MG tablet, Take 50 mg by mouth daily., Disp: , Rfl:    SYRINGE-NEEDLE, DISP, 3 ML (B-D 3CC LUER-LOK SYR 18GX1-1/2) 18G X 1-1/2" 3 ML MISC, 1 each by miscellaneous route Once Daily., Disp: , Rfl:    SYRINGE-NEEDLE, DISP, 3 ML (B-D 3CC LUER-LOK SYR 18GX1-1/2) 18G X 1-1/2" 3 ML MISC, 3 Syringes by miscellaneous route Once Daily., Disp: , Rfl:    SYRINGE-NEEDLE, DISP, 3 ML (B-D 3CC LUER-LOK SYR 18GX1-1/2) 18G X 1-1/2" 3 ML MISC, 1 each by Misc.(Non-Drug; Combo Route) route daily., Disp: , Rfl:    SYRINGE-NEEDLE, DISP, 3 ML (B-D 3CC LUER-LOK SYR 18GX1-1/2) 18G X 1-1/2" 3 ML MISC, 3 Syringes by Misc.(Non-Drug; Combo Route) route daily., Disp: , Rfl:    SYRINGE-NEEDLE, DISP, 3 ML (EASY TOUCH FLIPLOCK SAFETY SYR) 18G X 1-1/2" 3 ML MISC, 1 each by Misc.(Non-Drug; Combo Route) route daily., Disp: , Rfl:    SYRINGE-NEEDLE, DISP, 3 ML (EASY TOUCH FLIPLOCK SAFETY SYR) 18G X 1-1/2" 3 ML MISC, 1 each by miscellaneous route Once Daily., Disp: , Rfl:     traMADol-acetaminophen (ULTRACET) 37.5-325 MG tablet, , Disp: , Rfl:    triamcinolone cream (KENALOG) 0.1 %, Apply 1 Application topically as directed., Disp: , Rfl:    zolpidem (AMBIEN) 10 MG tablet, Take 10 mg by mouth at bedtime as needed for sleep., Disp: , Rfl:   Past Medical Problems: Past Medical History:  Diagnosis Date   Anxiety    Chest pain    Depression    Fibromyalgia    Lower abdominal pain     Past Surgical History: Past Surgical History:  Procedure Laterality Date   CARPAL TUNNEL RELEASE     CESAREAN SECTION     TUBAL LIGATION     tubal reversal      Social History: Social History   Socioeconomic History   Marital status: Single    Spouse name: Not on file   Number of children: Not on file   Years of education: Not on file   Highest education level: Not on file  Occupational History   Not on file  Tobacco Use   Smoking status: Never   Smokeless tobacco: Never  Vaping Use   Vaping status: Never Used  Substance and Sexual Activity   Alcohol use: No   Drug use: No   Sexual activity: Yes    Birth control/protection: Surgical  Other Topics Concern   Not on file  Social History Narrative   Not on file   Social Determinants of Health   Financial Resource Strain: Not on file  Food Insecurity: Not on file  Transportation Needs: Not on file  Physical Activity: Inactive (01/11/2021)   Received from [facility], [facility]   Exercise Vital Sign    Days of Exercise per Week: 0 days    Minutes of Exercise per Session: 0 min  Stress: Stress Concern Present (01/11/2021)   Received from [facility], [facility]  [employer] [facility] - Occupational Stress Questionnaire    Feeling of Stress : Very much  Social Connections: Unknown (06/21/2021)   Received from [facility], [facility]   Social Network    Social Network: Not on file  Intimate Partner Violence: Not At Risk (04/20/2022)   Received from [facility],  [facility]   Humiliation, Afraid, Rape, and Kick questionnaire    Fear of Current or Ex-Partner: No    Emotionally Abused: No    Physically Abused: No    Sexually Abused: No    Family History: No family history on file.  Review of Systems: Denies any fevers or chills or changes in her health  Physical Exam: Vital Signs LMP 09/28/2022 (Exact Date)   Physical Exam  Constitutional:      General: Not in acute distress.    Appearance: Normal appearance. Not ill-appearing.  HENT:     Head: Normocephalic and atraumatic.  Neck:     Musculoskeletal: Normal range of motion.  Cardiovascular:     Rate and Rhythm: Normal rate Pulmonary:     Effort: Pulmonary effort is normal. No respiratory distress.  Musculoskeletal: Normal range of motion.  Skin:    General: Skin is warm and dry.     Findings: No erythema or rash.  Neurological:     Mental Status: Alert and oriented to person, place, and time. Mental status is at baseline.  Psychiatric:        Mood and Affect: Mood normal.        Behavior: Behavior normal.    Assessment/Plan: The patient is scheduled for bilateral breast reduction with Dr. Ladona Ridgel.  Risks, benefits, and alternatives of procedure discussed, questions answered and consent obtained.    Smoking Status: Non-smoker; Counseling Given?  N/A Last Mammogram: N/A due to age  Caprini Score: 3; Risk Factors include: Age, BMI greater than 25, and length of planned surgery. Recommendation for mechanical prophylaxis. Encourage early ambulation.   Pictures obtained: @consult   Post-op Rx sent to pharmacy:  Zofran, patient is going to reach out to her pain management provider for postoperative pain medication.  Patient was in agreement with this plan.  Instructed patient to hold any vitamins, supplements at least 1 week prior to surgery.  Patient was provided with the breast reduction and General Surgical Risk consent document and Pain Medication Agreement prior to their  appointment.  They had adequate time to read through the risk consent documents and Pain Medication Agreement. We also discussed them in person together during this preop appointment. All of their questions were answered to their satisfaction.  Recommended calling if they have any further questions.  Risk consent form and Pain Medication Agreement to be scanned into patient's chart.  The risk that can be encountered with breast reduction were discussed and include the following but not limited to these:  Breast asymmetry, fluid accumulation, firmness of the breast, inability to breast feed, loss of nipple or areola, skin loss, decrease or no nipple sensation, fat necrosis of the breast tissue, bleeding, infection, healing delay.  There are risks of anesthesia, changes to skin sensation and injury to nerves or blood vessels.  The muscle can be temporarily or permanently injured.  You may have an allergic reaction to tape, suture, glue, blood products which can result in skin discoloration, swelling, pain, skin lesions, poor healing.  Any of these can lead to the need for revisonal surgery or stage procedures.  A reduction has potential to interfere  with diagnostic procedures.  Nipple or breast piercing can increase risks of infection.  This procedure is best done when the breast is fully developed.  Changes in the breast will continue to occur over time.  Pregnancy can alter the outcomes of previous breast reduction surgery, weight gain and weigh loss can also effect the long term appearance.     Electronically signed by: Laurena Spies, PA-C 10/23/2022 1:02 PM

## 2022-10-23 NOTE — Progress Notes (Signed)
Patient ID: Theresa Holt, female    DOB: Jul 27, 1985, 37 y.o.   MRN: [id number]  No chief complaint on file.   No diagnosis found.   History of Present Illness: Theresa Stickle is a 37 y.o.  female  with a history of macromastia.  She presents for preoperative evaluation for upcoming procedure, Bilateral Breast Reduction, scheduled for 10/27/2022 with Dr.  Ladona Ridgel  The patient has not had problems with anesthesia.  Patient did state that she needed a mammogram about 2 to 3 years ago as ordered by her gynecologist, but it was negative and she did not need anymore mammograms from that point on.  Patient denies any personal or family history of breast cancer.  She denies any history of cardiac disease.  She denies taking any blood thinners.  Patient reports she is not a smoker.  Patient denies taking any birth control or hormone replacement.  She reports history of 1 ectopic pregnancy in 2021.  She denies any personal or family history of blood clots or clotting diseases.  She denies any recent traumas, surgeries or infections.  She denies any history of stroke or heart attack.  She denies any history of Crohn's disease or ulcerative colitis.  She denies any history of COPD or asthma.  She denies any history of cancer.  She denies any varicosities to her lower extremities.  She denies any recent fevers, chills or changes in her health.  Patient states that she is currently a 42G cup and would like to be a full C/D cup.  I discussed with the patient that cup size cannot be guaranteed.  Patient expressed understanding.  Summary of Previous Visit: Patient was seen for consult initially by Dr. Ladona Ridgel on 08/07/2022.  Patient at this visit reported she was experiencing back and neck pain attributed to her the large size of her breasts.  On exam, her breasts were large and pendulous with grade 3 ptosis.  Her STN on the right was 35 cm and her STN on the left was 35 cm.  Estimated excess breast tissue to be  removed at time of surgery: 900 grams  Job: Physicist, medical, planning to take 1 week off  PMH Significant for: Anxiety, hyperlipidemia, insomnia, macromastia  Patient states she currently sees a pain management provider, Carmel Sacramento at [employer] and receives [employer] from him.  Discussed with patient that she will have to reach out to them for postoperative pain management.  Discussed with her that she should reach out to them prior to surgery so she is covered for her after surgery.  Patient expressed understanding and states that she is going to call them today or tomorrow.   Past Medical History: Allergies: Allergies  Allergen Reactions   Codeine Itching    Current Medications:  Current Outpatient Medications:    albuterol (VENTOLIN HFA) 108 (90 Base) MCG/ACT inhaler, Inhale 2 puffs into the lungs every 4 (four) hours as needed for wheezing or shortness of breath., Disp: , Rfl:    ARIPiprazole (ABILIFY) 5 MG tablet, Take 5 mg by mouth daily., Disp: , Rfl:    atorvastatin (LIPITOR) 40 MG tablet, Take 40 mg by mouth daily., Disp: , Rfl:    buPROPion (WELLBUTRIN XL) 300 MG 24 hr tablet, Take 300 mg by mouth daily., Disp: , Rfl:    butalbital-acetaminophen-caffeine (FIORICET) 50-325-40 MG tablet, Take 1 tablet by mouth every 6 (six) hours as needed for headache or migraine., Disp: , Rfl:    clindamycin (CLEOCIN)  300 MG capsule, Take 300 mg by mouth every 8 (eight) hours., Disp: , Rfl:    clonazePAM (KLONOPIN) 0.5 MG tablet, Take 0.5 mg by mouth as needed for anxiety., Disp: , Rfl:    cyclobenzaprine (FLEXERIL) 10 MG tablet, Take 10 mg by mouth at bedtime., Disp: , Rfl:    diphenhydrAMINE (BENADRYL) 25 mg capsule, Take 25 mg by mouth every 6 (six) hours as needed for itching, allergies or sleep., Disp: , Rfl:    doxycycline (ADOXA) 100 MG tablet, Take 100 mg by mouth 2 (two) times daily., Disp: , Rfl:    DULoxetine (CYMBALTA) 60 MG capsule, Take 1 capsule by mouth daily., Disp: , Rfl:     escitalopram (LEXAPRO) 20 MG tablet, Take 20 mg by mouth daily., Disp: , Rfl:    fluconazole (DIFLUCAN) 150 MG tablet, Take 150 mg by mouth once., Disp: , Rfl:    fluticasone (FLONASE) 50 MCG/ACT nasal spray, Place 1 spray into both nostrils daily., Disp: , Rfl:    gabapentin (NEURONTIN) 300 MG capsule, Take 300 mg by mouth at bedtime., Disp: , Rfl:    human chorionic gonadotropin (PREGNYL/NOVAREL) 10000 units injection, Inject 10,000 Units as directed once., Disp: , Rfl:    HYDROcodone-acetaminophen (NORCO) 10-325 MG tablet, Take 1 tablet by mouth every 6 (six) hours as needed for moderate pain., Disp: , Rfl:    HYDROcodone-acetaminophen (NORCO) 7.5-325 MG tablet, Take 1 tablet by mouth 3 (three) times daily as needed., Disp: , Rfl:    HYDROmorphone (DILAUDID) 2 MG tablet, Take 2 mg by mouth as directed., Disp: , Rfl:    hydrOXYzine (ATARAX) 10 MG tablet, Take 1 tablet (10 mg total) by mouth 3 (three) times daily as needed., Disp: 75 tablet, Rfl: 2   hydrOXYzine (ATARAX) 25 MG tablet, Take 25 mg by mouth every 8 (eight) hours as needed for anxiety., Disp: , Rfl:    HYDROXYZINE HCL PO, Take by mouth., Disp: , Rfl:    ketorolac (TORADOL) 10 MG tablet, Take 10 mg by mouth every 8 (eight) hours as needed for moderate pain or severe pain., Disp: , Rfl:    letrozole (FEMARA) 2.5 MG tablet, Take 2.5 mg by mouth daily., Disp: , Rfl:    letrozole (FEMARA) 2.5 MG tablet, Take 2.5 mg by mouth daily., Disp: , Rfl:    linaclotide (LINZESS) 290 MCG CAPS capsule, Take 290 mcg by mouth daily before breakfast., Disp: , Rfl:    linaclotide (LINZESS) 72 MCG capsule, Take 72 mcg by mouth daily before breakfast., Disp: , Rfl:    loratadine (CLARITIN) 10 MG tablet, Take 10 mg by mouth daily as needed for allergies., Disp: , Rfl:    metformin (FORTAMET) 500 MG (OSM) 24 hr tablet, Take 500 mg by mouth daily with breakfast., Disp: , Rfl:    metFORMIN (GLUCOPHAGE) 500 MG tablet, Take 500 mg by mouth daily with  breakfast., Disp: , Rfl:    methylPREDNISolone (MEDROL DOSEPAK) 4 MG TBPK tablet, Take 4 mg by mouth See admin instructions. follow package directions, Disp: , Rfl:    metroNIDAZOLE (FLAGYL) 500 MG tablet, Take 500 mg by mouth 3 (three) times daily., Disp: , Rfl:    MILI 0.25-35 MG-MCG tablet, Take 1 tablet by mouth daily., Disp: , Rfl:    NEEDLE, DISP, 30 G (BD DISP NEEDLES) 30G X 1/2" MISC, 1 each by Misc.(Non-Drug; Combo Route) route daily., Disp: , Rfl:    NEEDLE, DISP, 30 G (BD DISP NEEDLES) 30G X 1/2" MISC, 1 each  by miscellaneous route Once Daily., Disp: , Rfl:    omeprazole (PRILOSEC) 40 MG capsule, Take 40 mg by mouth daily., Disp: , Rfl:    pantoprazole (PROTONIX) 40 MG tablet, Take 40 mg by mouth daily., Disp: , Rfl:    QUEtiapine (SEROQUEL) 25 MG tablet, Take 0.5-1 tablets by mouth at bedtime., Disp: , Rfl:    QUEtiapine (SEROQUEL) 50 MG tablet, Take 1 tablet (50 mg total) by mouth at bedtime., Disp: 30 tablet, Rfl: 2   sertraline (ZOLOFT) 50 MG tablet, Take 50 mg by mouth daily., Disp: , Rfl:    SYRINGE-NEEDLE, DISP, 3 ML (B-D 3CC LUER-LOK SYR 18GX1-1/2) 18G X 1-1/2" 3 ML MISC, 1 each by miscellaneous route Once Daily., Disp: , Rfl:    SYRINGE-NEEDLE, DISP, 3 ML (B-D 3CC LUER-LOK SYR 18GX1-1/2) 18G X 1-1/2" 3 ML MISC, 3 Syringes by miscellaneous route Once Daily., Disp: , Rfl:    SYRINGE-NEEDLE, DISP, 3 ML (B-D 3CC LUER-LOK SYR 18GX1-1/2) 18G X 1-1/2" 3 ML MISC, 1 each by Misc.(Non-Drug; Combo Route) route daily., Disp: , Rfl:    SYRINGE-NEEDLE, DISP, 3 ML (B-D 3CC LUER-LOK SYR 18GX1-1/2) 18G X 1-1/2" 3 ML MISC, 3 Syringes by Misc.(Non-Drug; Combo Route) route daily., Disp: , Rfl:    SYRINGE-NEEDLE, DISP, 3 ML (EASY TOUCH FLIPLOCK SAFETY SYR) 18G X 1-1/2" 3 ML MISC, 1 each by Misc.(Non-Drug; Combo Route) route daily., Disp: , Rfl:    SYRINGE-NEEDLE, DISP, 3 ML (EASY TOUCH FLIPLOCK SAFETY SYR) 18G X 1-1/2" 3 ML MISC, 1 each by miscellaneous route Once Daily., Disp: , Rfl:     traMADol-acetaminophen (ULTRACET) 37.5-325 MG tablet, , Disp: , Rfl:    triamcinolone cream (KENALOG) 0.1 %, Apply 1 Application topically as directed., Disp: , Rfl:    zolpidem (AMBIEN) 10 MG tablet, Take 10 mg by mouth at bedtime as needed for sleep., Disp: , Rfl:   Past Medical Problems: Past Medical History:  Diagnosis Date   Anxiety    Chest pain    Depression    Fibromyalgia    Lower abdominal pain     Past Surgical History: Past Surgical History:  Procedure Laterality Date   CARPAL TUNNEL RELEASE     CESAREAN SECTION     TUBAL LIGATION     tubal reversal      Social History: Social History   Socioeconomic History   Marital status: Single    Spouse name: Not on file   Number of children: Not on file   Years of education: Not on file   Highest education level: Not on file  Occupational History   Not on file  Tobacco Use   Smoking status: Never   Smokeless tobacco: Never  Vaping Use   Vaping status: Never Used  Substance and Sexual Activity   Alcohol use: No   Drug use: No   Sexual activity: Yes    Birth control/protection: Surgical  Other Topics Concern   Not on file  Social History Narrative   Not on file   Social Determinants of Health   Financial Resource Strain: Not on file  Food Insecurity: Not on file  Transportation Needs: Not on file  Physical Activity: Inactive (01/11/2021)   Received from [facility], [facility]   Exercise Vital Sign    Days of Exercise per Week: 0 days    Minutes of Exercise per Session: 0 min  Stress: Stress Concern Present (01/11/2021)   Received from [facility], [facility]  [employer] [facility] - Occupational Stress Questionnaire    Feeling of Stress : Very much  Social Connections: Unknown (06/21/2021)   Received from [facility], [facility]   Social Network    Social Network: Not on file  Intimate Partner Violence: Not At Risk (04/20/2022)   Received from [facility],  [facility]   Humiliation, Afraid, Rape, and Kick questionnaire    Fear of Current or Ex-Partner: No    Emotionally Abused: No    Physically Abused: No    Sexually Abused: No    Family History: No family history on file.  Review of Systems: Denies any fevers or chills or changes in her health  Physical Exam: Vital Signs LMP 09/28/2022 (Exact Date)   Physical Exam  Constitutional:      General: Not in acute distress.    Appearance: Normal appearance. Not ill-appearing.  HENT:     Head: Normocephalic and atraumatic.  Neck:     Musculoskeletal: Normal range of motion.  Cardiovascular:     Rate and Rhythm: Normal rate Pulmonary:     Effort: Pulmonary effort is normal. No respiratory distress.  Musculoskeletal: Normal range of motion.  Skin:    General: Skin is warm and dry.     Findings: No erythema or rash.  Neurological:     Mental Status: Alert and oriented to person, place, and time. Mental status is at baseline.  Psychiatric:        Mood and Affect: Mood normal.        Behavior: Behavior normal.    Assessment/Plan: The patient is scheduled for bilateral breast reduction with Dr. Ladona Ridgel.  Risks, benefits, and alternatives of procedure discussed, questions answered and consent obtained.    Smoking Status: Non-smoker; Counseling Given?  N/A Last Mammogram: N/A due to age  Caprini Score: 3; Risk Factors include: Age, BMI greater than 25, and length of planned surgery. Recommendation for mechanical prophylaxis. Encourage early ambulation.   Pictures obtained: @consult   Post-op Rx sent to pharmacy:  Zofran, patient is going to reach out to her pain management provider for postoperative pain medication.  Patient was in agreement with this plan.  Instructed patient to hold any vitamins, supplements at least 1 week prior to surgery.  Patient was provided with the breast reduction and General Surgical Risk consent document and Pain Medication Agreement prior to their  appointment.  They had adequate time to read through the risk consent documents and Pain Medication Agreement. We also discussed them in person together during this preop appointment. All of their questions were answered to their satisfaction.  Recommended calling if they have any further questions.  Risk consent form and Pain Medication Agreement to be scanned into patient's chart.  The risk that can be encountered with breast reduction were discussed and include the following but not limited to these:  Breast asymmetry, fluid accumulation, firmness of the breast, inability to breast feed, loss of nipple or areola, skin loss, decrease or no nipple sensation, fat necrosis of the breast tissue, bleeding, infection, healing delay.  There are risks of anesthesia, changes to skin sensation and injury to nerves or blood vessels.  The muscle can be temporarily or permanently injured.  You may have an allergic reaction to tape, suture, glue, blood products which can result in skin discoloration, swelling, pain, skin lesions, poor healing.  Any of these can lead to the need for revisonal surgery or stage procedures.  A reduction has potential to interfere  with diagnostic procedures.  Nipple or breast piercing can increase risks of infection.  This procedure is best done when the breast is fully developed.  Changes in the breast will continue to occur over time.  Pregnancy can alter the outcomes of previous breast reduction surgery, weight gain and weigh loss can also effect the long term appearance.     Electronically signed by: Laurena Spies, PA-C 10/23/2022 1:02 PM

## 2022-10-25 ENCOUNTER — Telehealth: Payer: Self-pay | Admitting: Plastic Surgery

## 2022-10-25 ENCOUNTER — Ambulatory Visit: Payer: Medicaid Other | Attending: Interventional Cardiology | Admitting: Interventional Cardiology

## 2022-10-25 NOTE — Telephone Encounter (Signed)
Patient called and stated that she spoke with her Pain Management doctor and they said they have sent the prescription to the pharmacy, however, the patient can not pick up the Rx until 10/27/22 because it is too soon.  She just wanted to let you know.  She can be reached at [phone]

## 2022-10-26 ENCOUNTER — Encounter: Payer: Self-pay | Admitting: Interventional Cardiology

## 2022-10-27 ENCOUNTER — Ambulatory Visit (HOSPITAL_BASED_OUTPATIENT_CLINIC_OR_DEPARTMENT_OTHER): Payer: Medicaid Other | Admitting: Certified Registered"

## 2022-10-27 ENCOUNTER — Encounter (HOSPITAL_BASED_OUTPATIENT_CLINIC_OR_DEPARTMENT_OTHER)
Admission: RE | Disposition: A | Discharge status: Home / Self Care | Payer: Self-pay | Source: Home / Self Care | Attending: Plastic Surgery

## 2022-10-27 ENCOUNTER — Ambulatory Visit (HOSPITAL_BASED_OUTPATIENT_CLINIC_OR_DEPARTMENT_OTHER)
Admission: RE | Admit: 2022-10-27 | Discharge: 2022-10-27 | Disposition: A | Discharge status: Home / Self Care | Payer: Medicaid Other | Attending: Plastic Surgery | Admitting: Plastic Surgery

## 2022-10-27 ENCOUNTER — Other Ambulatory Visit: Payer: Self-pay

## 2022-10-27 ENCOUNTER — Encounter (HOSPITAL_BASED_OUTPATIENT_CLINIC_OR_DEPARTMENT_OTHER): Payer: Self-pay | Admitting: Plastic Surgery

## 2022-10-27 DIAGNOSIS — M542 Cervicalgia: Secondary | ICD-10-CM | POA: Insufficient documentation

## 2022-10-27 DIAGNOSIS — N62 Hypertrophy of breast: Secondary | ICD-10-CM | POA: Diagnosis not present

## 2022-10-27 DIAGNOSIS — M797 Fibromyalgia: Secondary | ICD-10-CM | POA: Diagnosis not present

## 2022-10-27 DIAGNOSIS — Z01818 Encounter for other preprocedural examination: Secondary | ICD-10-CM

## 2022-10-27 DIAGNOSIS — N6082 Other benign mammary dysplasias of left breast: Secondary | ICD-10-CM | POA: Insufficient documentation

## 2022-10-27 DIAGNOSIS — M549 Dorsalgia, unspecified: Secondary | ICD-10-CM | POA: Insufficient documentation

## 2022-10-27 HISTORY — DX: Anxiety disorder, unspecified: F41.9

## 2022-10-27 HISTORY — DX: Depression, unspecified: F32.A

## 2022-10-27 HISTORY — DX: Fibromyalgia: M79.7

## 2022-10-27 HISTORY — PX: BREAST REDUCTION SURGERY: SHX8

## 2022-10-27 LAB — POCT PREGNANCY, URINE: Preg Test, Ur: NEGATIVE

## 2022-10-27 SURGERY — MAMMOPLASTY, REDUCTION
Anesthesia: General | Site: Breast | Laterality: Bilateral

## 2022-10-27 MED ORDER — HYDROMORPHONE HCL 1 MG/ML IJ SOLN
INTRAMUSCULAR | Status: AC
  Filled 2022-10-27: qty 0.5

## 2022-10-27 MED ORDER — PHENYLEPHRINE HCL (PRESSORS) 10 MG/ML IV SOLN
INTRAVENOUS | Status: DC | PRN
Start: 2022-10-27 — End: 2022-10-27
  Administered 2022-10-27: 160 ug via INTRAVENOUS

## 2022-10-27 MED ORDER — OXYCODONE HCL 5 MG/5ML PO SOLN: 5.0000 mg | Freq: Once | ORAL | Status: AC | PRN

## 2022-10-27 MED ORDER — PROPOFOL 500 MG/50ML IV EMUL
INTRAVENOUS | Status: DC | PRN
  Administered 2022-10-27: 35 ug/kg/min via INTRAVENOUS

## 2022-10-27 MED ORDER — DEXAMETHASONE SODIUM PHOSPHATE 4 MG/ML IJ SOLN
INTRAMUSCULAR | Status: DC | PRN
  Administered 2022-10-27: 10 mg via INTRAVENOUS

## 2022-10-27 MED ORDER — HYDROMORPHONE HCL 1 MG/ML IJ SOLN
INTRAMUSCULAR | Status: DC | PRN
Start: 2022-10-27 — End: 2022-10-27
  Administered 2022-10-27: .5 mg via INTRAVENOUS

## 2022-10-27 MED ORDER — SUGAMMADEX SODIUM 200 MG/2ML IV SOLN
INTRAVENOUS | Status: DC | PRN
Start: 2022-10-27 — End: 2022-10-27
  Administered 2022-10-27: 200 mg via INTRAVENOUS

## 2022-10-27 MED ORDER — SCOPOLAMINE 1 MG/3DAYS TD PT72
MEDICATED_PATCH | TRANSDERMAL | Status: AC
  Filled 2022-10-27: qty 1

## 2022-10-27 MED ORDER — CEFAZOLIN SODIUM-DEXTROSE 2-4 GM/100ML-% IV SOLN
INTRAVENOUS | Status: AC
  Filled 2022-10-27: qty 100

## 2022-10-27 MED ORDER — PROPOFOL 10 MG/ML IV BOLUS
INTRAVENOUS | Status: AC
  Filled 2022-10-27: qty 20

## 2022-10-27 MED ORDER — HEMOSTATIC AGENTS (NO CHARGE) OPTIME
TOPICAL | Status: DC | PRN
Start: 2022-10-27 — End: 2022-10-27

## 2022-10-27 MED ORDER — OXYCODONE HCL 5 MG PO TABS
5.0000 mg | ORAL_TABLET | Freq: Once | ORAL | Status: AC | PRN
  Administered 2022-10-27: 5 mg via ORAL

## 2022-10-27 MED ORDER — ONDANSETRON HCL 4 MG/2ML IJ SOLN
INTRAMUSCULAR | Status: AC
  Filled 2022-10-27: qty 2

## 2022-10-27 MED ORDER — ACETAMINOPHEN 325 MG PO TABS: 325.0000 mg | ORAL_TABLET | ORAL | Status: DC | PRN

## 2022-10-27 MED ORDER — FENTANYL CITRATE (PF) 100 MCG/2ML IJ SOLN
25.0000 ug | INTRAMUSCULAR | Status: DC | PRN
  Administered 2022-10-27: 50 ug via INTRAVENOUS
  Administered 2022-10-27 (×2): 25 ug via INTRAVENOUS

## 2022-10-27 MED ORDER — LIDOCAINE 2% (20 MG/ML) 5 ML SYRINGE
INTRAMUSCULAR | Status: AC
  Filled 2022-10-27: qty 5

## 2022-10-27 MED ORDER — ACETAMINOPHEN 160 MG/5ML PO SOLN: 325.0000 mg | ORAL | Status: DC | PRN

## 2022-10-27 MED ORDER — BUPIVACAINE LIPOSOME 1.3 % IJ SUSP
INTRAMUSCULAR | Status: AC
  Filled 2022-10-27: qty 20

## 2022-10-27 MED ORDER — DROPERIDOL 2.5 MG/ML IJ SOLN: 0.6250 mg | Freq: Once | INTRAMUSCULAR | Status: DC | PRN

## 2022-10-27 MED ORDER — SCOPOLAMINE 1 MG/3DAYS TD PT72
1.0000 | MEDICATED_PATCH | TRANSDERMAL | Status: DC
  Administered 2022-10-27: 1.5 mg via TRANSDERMAL

## 2022-10-27 MED ORDER — DEXAMETHASONE SODIUM PHOSPHATE 10 MG/ML IJ SOLN
INTRAMUSCULAR | Status: AC
  Filled 2022-10-27: qty 1

## 2022-10-27 MED ORDER — ROCURONIUM BROMIDE 100 MG/10ML IV SOLN
INTRAVENOUS | Status: DC | PRN
  Administered 2022-10-27 (×2): 20 mg via INTRAVENOUS
  Administered 2022-10-27: 60 mg via INTRAVENOUS

## 2022-10-27 MED ORDER — ACETAMINOPHEN 10 MG/ML IV SOLN
1000.0000 mg | Freq: Once | INTRAVENOUS | Status: DC | PRN
  Administered 2022-10-27: 1000 mg via INTRAVENOUS

## 2022-10-27 MED ORDER — OXYCODONE HCL 5 MG PO TABS
ORAL_TABLET | ORAL | Status: AC
  Filled 2022-10-27: qty 1

## 2022-10-27 MED ORDER — MIDAZOLAM HCL 5 MG/5ML IJ SOLN
INTRAMUSCULAR | Status: DC | PRN
  Administered 2022-10-27: 2 mg via INTRAVENOUS

## 2022-10-27 MED ORDER — FENTANYL CITRATE (PF) 100 MCG/2ML IJ SOLN
INTRAMUSCULAR | Status: AC
  Filled 2022-10-27: qty 2

## 2022-10-27 MED ORDER — PROMETHAZINE HCL 25 MG/ML IJ SOLN: 6.2500 mg | INTRAMUSCULAR | Status: DC | PRN

## 2022-10-27 MED ORDER — 0.9 % SODIUM CHLORIDE (POUR BTL) OPTIME
TOPICAL | Status: DC | PRN
  Administered 2022-10-27: 800 mL

## 2022-10-27 MED ORDER — BUPIVACAINE HCL (PF) 0.25 % IJ SOLN
INTRAMUSCULAR | Status: AC
  Filled 2022-10-27: qty 30

## 2022-10-27 MED ORDER — ONDANSETRON HCL 4 MG/2ML IJ SOLN
INTRAMUSCULAR | Status: DC | PRN
  Administered 2022-10-27: 4 mg via INTRAVENOUS

## 2022-10-27 MED ORDER — BUPIVACAINE LIPOSOME 1.3 % IJ SUSP
INTRAMUSCULAR | Status: DC | PRN
  Administered 2022-10-27 (×2): 20 mL via SURGICAL_CAVITY

## 2022-10-27 MED ORDER — PROPOFOL 10 MG/ML IV BOLUS
INTRAVENOUS | Status: DC | PRN
Start: 2022-10-27 — End: 2022-10-27
  Administered 2022-10-27: 200 mg via INTRAVENOUS

## 2022-10-27 MED ORDER — FENTANYL CITRATE (PF) 100 MCG/2ML IJ SOLN
INTRAMUSCULAR | Status: DC | PRN
  Administered 2022-10-27 (×2): 100 ug via INTRAVENOUS

## 2022-10-27 MED ORDER — MIDAZOLAM HCL 2 MG/2ML IJ SOLN
INTRAMUSCULAR | Status: AC
  Filled 2022-10-27: qty 2

## 2022-10-27 MED ORDER — LIDOCAINE HCL (CARDIAC) PF 100 MG/5ML IV SOSY
PREFILLED_SYRINGE | INTRAVENOUS | Status: DC | PRN
  Administered 2022-10-27: 100 mg via INTRAVENOUS

## 2022-10-27 MED ORDER — LACTATED RINGERS IV SOLN: INTRAVENOUS | Status: DC | PRN

## 2022-10-27 MED ORDER — ACETAMINOPHEN 10 MG/ML IV SOLN
INTRAVENOUS | Status: AC
  Filled 2022-10-27: qty 100

## 2022-10-27 MED ORDER — ROCURONIUM BROMIDE 10 MG/ML (PF) SYRINGE
PREFILLED_SYRINGE | INTRAVENOUS | Status: AC
  Filled 2022-10-27: qty 10

## 2022-10-27 SURGICAL SUPPLY — 57 items
ADH SKN CLS APL DERMABOND .7 (GAUZE/BANDAGES/DRESSINGS) ×4
BINDER BREAST 3XL (GAUZE/BANDAGES/DRESSINGS) IMPLANT
BINDER BREAST LRG (GAUZE/BANDAGES/DRESSINGS) IMPLANT
BINDER BREAST MEDIUM (GAUZE/BANDAGES/DRESSINGS) IMPLANT
BINDER BREAST XLRG (GAUZE/BANDAGES/DRESSINGS) IMPLANT
BINDER BREAST XXLRG (GAUZE/BANDAGES/DRESSINGS) IMPLANT
BIOPATCH RED 1 DISK 7.0 (GAUZE/BANDAGES/DRESSINGS) ×2 IMPLANT
BLADE SURG 10 STRL SS (BLADE) ×4 IMPLANT
BLADE SURG 15 STRL LF DISP TIS (BLADE) ×1 IMPLANT
BLADE SURG 15 STRL SS (BLADE) ×2
CANISTER SUCT 1200ML W/VALVE (MISCELLANEOUS) ×1 IMPLANT
DERMABOND ADVANCED .7 DNX12 (GAUZE/BANDAGES/DRESSINGS) ×2 IMPLANT
DRAIN CHANNEL 19F RND (DRAIN) ×2 IMPLANT
DRAPE IMP U-DRAPE 54X76 (DRAPES) IMPLANT
DRAPE UTILITY XL STRL (DRAPES) ×1 IMPLANT
DRSG TEGADERM 4X4.75 (GAUZE/BANDAGES/DRESSINGS) ×2 IMPLANT
ELECT BLADE 4.0 EZ CLEAN MEGAD (MISCELLANEOUS) ×1
ELECT REM PT RETURN 9FT ADLT (ELECTROSURGICAL) ×2
ELECTRODE BLDE 4.0 EZ CLN MEGD (MISCELLANEOUS) ×1 IMPLANT
ELECTRODE REM PT RTRN 9FT ADLT (ELECTROSURGICAL) ×2 IMPLANT
EVACUATOR SILICONE 100CC (DRAIN) ×2 IMPLANT
GAUZE PAD ABD 8X10 STRL (GAUZE/BANDAGES/DRESSINGS) ×2 IMPLANT
GAUZE SPONGE 2X2 STRL 8-PLY (GAUZE/BANDAGES/DRESSINGS) ×2 IMPLANT
GLOVE BIO SURGEON STRL SZ 6.5 (GLOVE) IMPLANT
GLOVE BIO SURGEON STRL SZ7.5 (GLOVE) ×1 IMPLANT
GLOVE BIO SURGEON STRL SZ8 (GLOVE) ×1 IMPLANT
GLOVE BIOGEL PI IND STRL 7.0 (GLOVE) IMPLANT
GLOVE BIOGEL PI IND STRL 8 (GLOVE) ×1 IMPLANT
GOWN STRL REUS W/ TWL LRG LVL3 (GOWN DISPOSABLE) IMPLANT
GOWN STRL REUS W/TWL LRG LVL3 (GOWN DISPOSABLE) ×1
GOWN STRL REUS W/TWL XL LVL3 (GOWN DISPOSABLE) ×1 IMPLANT
HEMOSTAT ARISTA ABSORB 3G PWDR (HEMOSTASIS) IMPLANT
HIBICLENS CHG 4% 4OZ BTL (MISCELLANEOUS) ×1 IMPLANT
MARKER SKIN DUAL TIP RULER LAB (MISCELLANEOUS) ×1 IMPLANT
NDL HYPO 25X1 1.5 SAFETY (NEEDLE) IMPLANT
NEEDLE HYPO 25X1 1.5 SAFETY (NEEDLE) ×1
NS IRRIG 1000ML POUR BTL (IV SOLUTION) ×1 IMPLANT
PACK BASIN DAY SURGERY FS (CUSTOM PROCEDURE TRAY) ×1 IMPLANT
PACK UNIVERSAL I (CUSTOM PROCEDURE TRAY) ×1 IMPLANT
PENCIL SMOKE EVACUATOR (MISCELLANEOUS) ×2 IMPLANT
PIN SAFETY STERILE (MISCELLANEOUS) IMPLANT
SLEEVE SCD COMPRESS KNEE MED (STOCKING) ×1 IMPLANT
SPONGE T-LAP 18X18 ~~LOC~~+RFID (SPONGE) ×3 IMPLANT
STAPLER VISISTAT 35W (STAPLE) ×1 IMPLANT
SUT MNCRL AB 3-0 PS2 27 (SUTURE) ×4 IMPLANT
SUT MNCRL AB 4-0 PS2 18 (SUTURE) ×4 IMPLANT
SUT MON AB 2-0 CT1 36 (SUTURE) ×1 IMPLANT
SUT MON AB 5-0 PS2 18 (SUTURE) IMPLANT
SUT SILK 2 0 SH (SUTURE) ×2 IMPLANT
SUT VIC AB 3-0 SH 27 (SUTURE)
SUT VIC AB 3-0 SH 27X BRD (SUTURE) IMPLANT
SYR BULB IRRIG 60ML STRL (SYRINGE) ×1 IMPLANT
TOWEL GREEN STERILE FF (TOWEL DISPOSABLE) ×2 IMPLANT
TRAY DSU PREP LF (CUSTOM PROCEDURE TRAY) ×1 IMPLANT
TUBE CONNECTING 20X1/4 (TUBING) ×1 IMPLANT
UNDERPAD 30X36 HEAVY ABSORB (UNDERPADS AND DIAPERS) ×2 IMPLANT
YANKAUER SUCT BULB TIP NO VENT (SUCTIONS) ×1 IMPLANT

## 2022-10-27 NOTE — Transfer of Care (Signed)
Immediate Anesthesia Transfer of Care Note  Patient: Theresa Holt  Procedure(s) Performed: bilateral breast reduction (Bilateral: Breast)  Patient Location: PACU  Anesthesia Type:General  Level of Consciousness: awake, alert , oriented, drowsy, and patient cooperative  Airway & Oxygen Therapy: Patient Spontanous Breathing and Patient connected to face mask oxygen  Post-op Assessment: Report given to RN and Post -op Vital signs reviewed and stable  Post vital signs: Reviewed and stable  Last Vitals:  Vitals Value Taken Time  BP    Temp    Pulse 86 10/27/22 1451  Resp    SpO2 100 % 10/27/22 1451  Vitals shown include unfiled device data.  Last Pain:  Vitals:   10/27/22 0949  TempSrc: Oral  PainSc: 0-No pain      Patients Stated Pain Goal: 3 (10/27/22 0949)  Complications:  Encounter Notable Events  Notable Event Outcome Phase Comment  Difficult to intubate - unexpected  Intraprocedure Filed from anesthesia note documentation.

## 2022-10-27 NOTE — Discharge Instructions (Addendum)
Activity: Avoid strenuous activity.  No lifting, pushing, or pulling greater than 15 pounds.  Diet: No restrictions.  Try to optimize nutrition with plenty of proteins, fruits, and vegetables to improve healing.   Wound Care: Leave breast binder on for the first week and then you may transition to a front-clipping or front-zipping compression bra.  Sponge bathe only until our visit Monday, then we can discuss transition to showering with the emphasis on keeping the surgical site dry.  Replace the ABD pads over incisions with gauze or Maxi pads as needed for incisional drainage.   If you have drains placed, be sure to record the daily output from each side. They will be removed at your appointment Monday. Please make sure that the bulbs are charged.  If you experience any issues, please call the office.    Follow-Up: Scheduled for tomorrow.  Things to watch for:  Call the office if you experience fever, chills, intractable vomiting, or significant bleeding.  Mild wound drainage is common after breast reduction surgery and should not be cause for alarm.    Post Anesthesia Home Care Instructions  Activity: Get plenty of rest for the remainder of the day. A responsible individual must stay with you for 24 hours following the procedure.  For the next 24 hours, DO NOT: -Drive a car -Advertising copywriter -Drink alcoholic beverages -Take any medication unless instructed by your physician -Make any legal decisions or sign important papers.  Meals: Start with liquid foods such as gelatin or soup. Progress to regular foods as tolerated. Avoid greasy, spicy, heavy foods. If nausea and/or vomiting occur, drink only clear liquids until the nausea and/or vomiting subsides. Call your physician if vomiting continues.  Special Instructions/Symptoms: Your throat may feel dry or sore from the anesthesia or the breathing tube placed in your throat during surgery. If this causes discomfort, gargle with warm salt  water. The discomfort should disappear within 24 hours.  If you had a scopolamine patch placed behind your ear for the management of post- operative nausea and/or vomiting:  1. The medication in the patch is effective for 72 hours, after which it should be removed.  Wrap patch in a tissue and discard in the trash. Wash hands thoroughly with soap and water. 2. You may remove the patch earlier than 72 hours if you experience unpleasant side effects which may include dry mouth, dizziness or visual disturbances. 3. Avoid touching the patch. Wash your hands with soap and water after contact with the patch.     Information for Discharge Teaching: EXPAREL (bupivacaine liposome injectable suspension)   Pain relief is important to your recovery. The goal is to control your pain so you can move easier and return to your normal activities as soon as possible after your procedure. Your physician may use several types of medicines to manage pain, swelling, and more.  Your surgeon or anesthesiologist gave you EXPAREL(bupivacaine) to help control your pain after surgery.  EXPAREL is a local anesthetic designed to release slowly over an extended period of time to provide pain relief by numbing the tissue around the surgical site. EXPAREL is designed to release pain medication over time and can control pain for up to 72 hours. Depending on how you respond to EXPAREL, you may require less pain medication during your recovery. EXPAREL can help reduce or eliminate the need for opioids during the first few days after surgery when pain relief is needed the most. EXPAREL is not an opioid and is not  addictive. It does not cause sleepiness or sedation.   Important! A teal colored band has been placed on your arm with the date, time and amount of EXPAREL you have received. Please leave this armband in place for the full 96 hours following administration, and then you may remove the band. If you return to the hospital  for any reason within 96 hours following the administration of EXPAREL, the armband provides important information that your health care providers to know, and alerts them that you have received this anesthetic.    Possible side effects of EXPAREL: Temporary loss of sensation or ability to move in the area where medication was injected. Nausea, vomiting, constipation Rarely, numbness and tingling in your mouth or lips, lightheadedness, or anxiety may occur. Call your doctor right away if you think you may be experiencing any of these sensations, or if you have other questions regarding possible side effects.  Follow all other discharge instructions given to you by your surgeon or nurse. Eat a healthy diet and drink plenty of water or other fluids.    About my Jackson-Pratt Bulb Drain  What is a Jackson-Pratt bulb? A Jackson-Pratt is a soft, round device used to collect drainage. It is connected to a long, thin drainage catheter, which is held in place by one or two small stiches near your surgical incision site. When the bulb is squeezed, it forms a vacuum, forcing the drainage to empty into the bulb.  Emptying the Jackson-Pratt bulb- To empty the bulb: 1. Release the plug on the top of the bulb. 2. Pour the bulb's contents into a measuring container which your nurse will provide. 3. Record the time emptied and amount of drainage. Empty the drain(s) as often as your     doctor or nurse recommends.  Date                  Time                    Amount (Drain 1)                 Amount (Drain  2)  _____________________________________________________________________  _____________________________________________________________________  _____________________________________________________________________  _____________________________________________________________________  _____________________________________________________________________  _____________________________________________________________________  _____________________________________________________________________  _____________________________________________________________________  Squeezing the Jackson-Pratt Bulb- To squeeze the bulb: 1. Make sure the plug at the top of the bulb is open. 2. Squeeze the bulb tightly in your fist. You will hear air squeezing from the bulb. 3. Replace the plug while the bulb is squeezed. 4. Use a safety pin to attach the bulb to your clothing. This will keep the catheter from     pulling at the bulb insertion site.  When to call your doctor- Call your doctor if: Drain site becomes red, swollen or hot. You have a fever greater than 101 degrees F. There is oozing at the drain site. Drain falls out (apply a guaze bandage over the drain hole and secure it with tape). Drainage increases daily not related to activity patterns. (You will usually have more drainage when you are active than when you are resting.) Drainage has a bad odor.   May have Tylenol today after 9:10 PM

## 2022-10-27 NOTE — Anesthesia Postprocedure Evaluation (Signed)
Anesthesia Post Note  Patient: Theresa Holt  Procedure(s) Performed: bilateral breast reduction (Bilateral: Breast)     Patient location during evaluation: PACU Anesthesia Type: General Level of consciousness: awake and alert Pain management: pain level controlled Vital Signs Assessment: post-procedure vital signs reviewed and stable Respiratory status: spontaneous breathing, nonlabored ventilation, respiratory function stable and patient connected to nasal cannula oxygen Cardiovascular status: blood pressure returned to baseline and stable Postop Assessment: no apparent nausea or vomiting Anesthetic complications: yes  Encounter Notable Events  Notable Event Outcome Phase Comment  Difficult to intubate - unexpected  Intraprocedure Filed from anesthesia note documentation.    Last Vitals:  Vitals:   10/27/22 1600 10/27/22 1630  BP: (!) 137/92 (!) 169/88  Pulse: 80 88  Resp: 20 16  Temp:  (!) 36.3 C  SpO2: 98% 95%    Last Pain:  Vitals:   10/27/22 1630  TempSrc:   PainSc: 6                  Shelton Silvas

## 2022-10-27 NOTE — Anesthesia Procedure Notes (Signed)
Procedure Name: Intubation Date/Time: 10/27/2022 12:02 PM  Performed by: Karen Kitchens, CRNAPre-anesthesia Checklist: Patient identified, Emergency Drugs available, Suction available and Patient being monitored Patient Re-evaluated:Patient Re-evaluated prior to induction Oxygen Delivery Method: Circle system utilized Preoxygenation: Pre-oxygenation with 100% oxygen Induction Type: IV induction Ventilation: Mask ventilation without difficulty Laryngoscope Size: Mac, 4 and Glidescope Grade View: Grade III Tube type: Oral Tube size: 7.0 mm Number of attempts: 1 Airway Equipment and Method: Stylet and Oral airway Placement Confirmation: ETT inserted through vocal cords under direct vision, positive ETCO2, breath sounds checked- equal and bilateral and CO2 detector Secured at: 23 cm Tube secured with: Tape Dental Injury: Teeth and Oropharynx as per pre-operative assessment  Difficulty Due To: Difficulty was unanticipated, Difficult Airway- due to large tongue, Difficult Airway- due to reduced neck mobility, Difficult Airway- due to anterior larynx and Difficult Airway- due to limited oral opening Future Recommendations: Recommend- induction with short-acting agent, and alternative techniques readily available Comments: Recommend glidescope intubation in future.  The patient has limited head extension d/t hair bun.  The patient's larynx is anterior, we notched the HOB back on OR table during induction to facilitate sniffing position.  Unable to visualize anything other than the epiglottis with a mac 4 blade; second attempt by anesthesiologist also could only view epiglottis and with firm pressure and head lift the arytenoids.  Anesthesiologist used a glidescope on the 3rd attempt and had a grade I view.  Intubated atraumatically with glidescope.  The patient was also a 2 hand mask with oral airway.

## 2022-10-27 NOTE — Op Note (Signed)
DATE OF OPERATION: 10/27/2022  LOCATION: [facility] operating Room  PREOPERATIVE DIAGNOSIS: Symptomatic macromastia  POSTOPERATIVE DIAGNOSIS: Same  PROCEDURE: Bilateral breast reduction  SURGEON: Loren Racer, MD  ASSISTANT: Evelena Leyden, primary, Matt Scheeler  EBL: 100 cc  CONDITION: Stable  COMPLICATIONS: None  INDICATION: The patient, Theresa Holt, is a 37 y.o. female born on 1986-01-07, is here for treatment upper back and neck pain secondary to large size of breast.   PROCEDURE DETAILS:  The patient was seen prior to surgery and marked.   IV antibiotics were given. The patient was taken to the operating room and given a general anesthetic. A standard time out was performed and all information was confirmed by those in the room. SCDs were placed.   The chest was prepped and draped in usual sterile manner.  The nipples were marked with a 42 mm cookie cutter and an 8 cm based inferior pedicle was outlined on the each breast.  The right breast was addressed first.  A laparotomy tape was placed at the base of the breast and the pedicle was de-epithelialized sharply.  The pedicle was dissected down to the chest wall with the electrocautery.  Electrocautery was used to resect the medial lateral and superior triangles of breast tissue and to develop and thin the superior skin flap.  The tissue removed constituted the bulk of the reduction.  Tissue removed weighed 1096 g and was sent to pathology for routine examination.  Hemostasis was achieved with electrocautery.  The wound was irrigated with warm normal saline.  Exparel was injected under the pectoralis fashion and the subcutaneous tissues.  A 19 French round drain was placed behind the pedicle and brought out through a separate stab incision.  The T point was approximated with a single 2-0 Monocryl suture.  Skin edges were tailor tacked in place with skin clips.  The dermis was closed with interrupted and running 3-0 Monocryl sutures.   Skin was closed with a running 4-0 Monocryl subcuticular stitch.  Attention was turned to the left breast where similar procedure was performed.  After placing a laparotomy tape at the base of the breast the pedicle was de-epithelialized sharply and dissected down to the chest wall.  The medial lateral and superior triangles of breast tissue were resected with the electrocautery and the superior skin flap was developed and thinned.  The total breast tissue removed on the left side was 1093 g and this was sent to pathology for routine examination.  Again hemostasis was achieved with the electrocautery and the wound irrigated with warm normal saline.  A 19 French round drain was placed behind the pedicle and brought out through a separate stab incision.  The T point was approximated and the skin edges tailor tacked in place.  The dermis was closed with interrupted 3-0 Monocryl sutures and the skin was closed with a running 4-0 Monocryl subcuticular stitch.  The incisions were sealed with Dermabond the patient was placed in a supportive garment.  She was awakened from anesthesia without incident transferred to the recovery room in good condition.  All instrument needle and sponge counts reported as correct and there were no complications. The patient was allowed to wake up and taken to recovery room in stable condition at the end of the case. The family was notified at the end of the case.   The advanced practice practitioner (APP) assisted throughout the case.  The APP was essential in retraction and counter traction when needed to make the  case progress smoothly.  This retraction and assistance made it possible to see the tissue plans for the procedure.  The assistance was needed for blood control, tissue re-approximation and assisted with closure of the incision site.

## 2022-10-27 NOTE — Anesthesia Preprocedure Evaluation (Signed)
Anesthesia Evaluation  Patient identified by MRN, date of birth, ID band Patient awake    Reviewed: Allergy & Precautions, NPO status , Patient's Chart, lab work & pertinent test results  Airway Mallampati: II  TM Distance: >3 FB Neck ROM: Full    Dental  (+) Teeth Intact, Dental Advisory Given   Pulmonary neg pulmonary ROS   breath sounds clear to auscultation       Cardiovascular negative cardio ROS  Rhythm:Regular Rate:Normal     Neuro/Psych  Headaches PSYCHIATRIC DISORDERS Anxiety Depression     Neuromuscular disease    GI/Hepatic negative GI ROS, Neg liver ROS,,,  Endo/Other  negative endocrine ROS    Renal/GU negative Renal ROS     Musculoskeletal  (+)  Fibromyalgia -  Abdominal   Peds  Hematology negative hematology ROS (+)   Anesthesia Other Findings   Reproductive/Obstetrics                             Anesthesia Physical Anesthesia Plan  ASA: 2  Anesthesia Plan: General   Post-op Pain Management: Tylenol PO (pre-op)*, Toradol IV (intra-op)* and Dilaudid IV   Induction: Intravenous  PONV Risk Score and Plan: 4 or greater and Ondansetron, Dexamethasone, Midazolam and Scopolamine patch - Pre-op  Airway Management Planned: Oral ETT  Additional Equipment: None  Intra-op Plan:   Post-operative Plan: Extubation in OR  Informed Consent: I have reviewed the patients History and Physical, chart, labs and discussed the procedure including the risks, benefits and alternatives for the proposed anesthesia with the patient or authorized representative who has indicated his/her understanding and acceptance.     Dental advisory given  Plan Discussed with: CRNA  Anesthesia Plan Comments:        Anesthesia Quick Evaluation

## 2022-10-27 NOTE — Interval H&P Note (Signed)
History and Physical Interval Note: No change in exam or indication for surgery. All questions answered to her satisfaction. Marked for a breast reduction with her assistance. Will proceed at her request.  10/27/2022 11:08 AM  Theresa Holt  has presented today for surgery, with the diagnosis of Hypertrophy of breast.  The various methods of treatment have been discussed with the patient and family. After consideration of risks, benefits and other options for treatment, the patient has consented to  Procedure(s): bilateral breast reduction (Bilateral) as a surgical intervention.  The patient's history has been reviewed, patient examined, no change in status, stable for surgery.  I have reviewed the patient's chart and labs.  Questions were answered to the patient's satisfaction.     Santiago Glad

## 2022-10-30 ENCOUNTER — Encounter: Payer: Self-pay | Admitting: Surgical

## 2022-10-30 ENCOUNTER — Ambulatory Visit (INDEPENDENT_AMBULATORY_CARE_PROVIDER_SITE_OTHER): Payer: Medicaid Other | Admitting: Surgical

## 2022-10-30 VITALS — BP 110/75 | HR 77

## 2022-10-30 DIAGNOSIS — N62 Hypertrophy of breast: Secondary | ICD-10-CM

## 2022-10-30 LAB — SURGICAL PATHOLOGY

## 2022-10-30 NOTE — Progress Notes (Signed)
37 year old female here for follow-up after bilateral breast reduction with Dr. Ladona Ridgel 10/27/2022.  She is 3 days postop.  She is here for bilateral JP drain removal.   Chaperone present on exam Bilateral NAC's are viable, bilateral breast incisions are intact. There is no erythema or cellulitic changes noted. No subcutaneous fluid collections noted with palpation. Bilateral JP drains are in place.   Recommend continuing with compressive garment 24/7 until 6 weeks post-op,  avoiding strenuous activity/heavy lifting until 6 weeks post-op  Recommend following up in 1 week All the patient's questions were answered to their content. Recommend calling with any questions or concerns.  JP drains were removed. Pt tolerated this well.  Recommend Vaseline and gauze over the area.

## 2022-11-08 ENCOUNTER — Ambulatory Visit (INDEPENDENT_AMBULATORY_CARE_PROVIDER_SITE_OTHER): Payer: Medicaid Other | Admitting: Plastic Surgery

## 2022-11-08 DIAGNOSIS — Z9889 Other specified postprocedural states: Secondary | ICD-10-CM

## 2022-11-08 NOTE — Progress Notes (Signed)
Theresa Holt returns today approximately 11 days postop from a bilateral breast reduction.  She is doing very well with no specific complaints.  She says initially she felt that she was a little on the small side however she is happy now with the overall results from her breast reduction.  On physical exam she is healing nicely.  All incisions are clean dry and intact and she has good symmetry.  Both nipples are well-perfused and she has increased feeling in the right nipple.  Continue with supportive garment as tolerated.  Discussed scar management.  Keep follow-up appointment.  Photographs obtained today with her consent.

## 2022-11-16 NOTE — Progress Notes (Signed)
BH MD Outpatient Progress Note  11/17/2022 11:20 AM Theresa Holt  MRN:  [id number]  Assessment:  Theresa Holt presents for follow-up evaluation. Today, 11/17/22, patient reports benefit from Seroquel for mood fluctuations and episodes of tearfulness. She denies any adverse effects. On psychiatric review, patient's description of mood episodes do not appear consistent with bipolar disorder - her description of "good" mood appears to represent normal euthymic state although she does experience anxiety that may lead to racing thoughts and trouble sleeping (with associated fatigue). Given benefit from Seroquel thus far, will continue for time being while carefully monitoring for side effects. If alternative agent is needed in the future, could consider SNRI as it doesn't appear she has had trial on chart review and she carries comorbidity of fibromyalgia. No changes to plan of care at this time other than allowing for more flexibility in Atarax PRN use and encouraged patient to discuss referral for sleep study with medical team given risk factors.  RTC in 2 months by video.  Identifying Information: Theresa Holt is a 37 y.o. female with a history of MDD, GAD, chronic pain and fibromyalgia on opioids who is an established patient with [facility].   Plan:  # MDD  GAD Past medication trials: Lexapro, Prozac, Zoloft, Seroquel, Atarax Status of problem: new problem to this provider Interventions: -- Continue Seroquel 50 mg nightly  -- While antipsychotic would typically not be used as single agent for depression, patient identifies benefit and will continue for time being -- INCREASE Atarax to 10-20 mg BID PRN anxiety/sleep -- Patient scheduled for individual psychotherapy with Richardson Dopp LCSW  # Delayed sleep phase  Snoring  Daytime fatigue Past medication trials: Atarax Status of problem: new problem to this provider Interventions: -- Atarax as above --  Encouraged to obtain sleep study through pain clinic or PCP (reports pain clinic had previously been pursuing sleep study)  # Medication monitoring Interventions: -- Seroquel:  -- Lipid profile: none on file; will address at next visit  -- HgbA1c: none on file; will address at next visit  -- EKG 06/13/22: Qtc 442  Patient was given contact information for behavioral health clinic and was instructed to call 911 for emergencies.   Subjective:  Chief Complaint:  Chief Complaint  Patient presents with   Medication Management    Interval History:   Chart review: -- Seen by Otila Back PA on 08/23/22 for initial assessment. At that time, diagnoses felt c/w MDD severe and GAD. Started on Seroquel 50 mg nightly at that time due to past benefit as well as Atarax 10 mg TID PRN anxiety.    Patient initially joins while driving - discussed that appt cannot be done while driving and she was able to pull off and park to complete appointment.  She reports she has been taking Seroquel and has found it helpful for mood swings (previously was quickly going from crying to happy). No longer having episodes of tearfulness and feels mood is much more even.  Typically describes self as "joyful" person. However notes she had been experiencing mood fluctuations "out of the blue" and hard to identify trigger. States mood would change from "moment to moment" with mood changes typically lasting hours at a time. When feeling low, would become tearful and withdrawn. Denies passive/active SI. Reports outside of this typically feeling "good and happy." Denies excessive energy, risky or impulsive behaviors. Reports she does have history of excessive spending although notes this is triggered by getting paycheck and  subsequently feeling excited about having money again.  Not sleeping well - sleeping between 5AM-10AM. Identifies trouble turning her brain off at night. Seroquel seemed to help at first but effects wore off.  Using Atarax about twice daily for anxiety/sleep. Reports snoring; denies apneic episodes. Reports daytime drowsiness. Denies morning headaches. Was supposed to have sleep study in the past but didn't get it done; believes she can ask pain doctor or PCP to put in referral again.   Reports fiance was incarcerated in 2022 for murder while she was away on a cruise. She is still with him but notes that mood symptoms arise during their visits.  She is not working; currently out of school as she recently had breast reduction 3 weeks ago.  Amenable to continuing Seroquel as currently prescribed with more flexibility in use of Atarax.  All questions/concerns addressed.   Medical conditions: -- on opioids prn for fibromyalgia and back pain   Visit Diagnosis:    ICD-10-CM   1. MDD (major depressive disorder), recurrent episode, moderate (HCC)  F33.1 QUEtiapine (SEROQUEL) 50 MG tablet    2. Generalized anxiety disorder  F41.1 hydrOXYzine (ATARAX) 10 MG tablet    QUEtiapine (SEROQUEL) 50 MG tablet     Past Psychiatric History:  Diagnoses: MDD vs. Bipolar disorder, GAD Medication trials: per chart review - Lexapro, Prozac, Zoloft, Seroquel, Atarax Previous psychiatrist/therapist: denies Hospitalizations: denies Suicide attempts: denies SIB: denies Hx of violence towards others: denies Current access to guns: denies Hx of trauma/abuse: denies Substance use:   -- Etoh: 1 drink monthly  -- Illicit drug use including cannabis: denies  -- Tobacco: denies  Past Medical History:  Past Medical History:  Diagnosis Date   Anxiety    Chest pain    Depression    Fibromyalgia    Lower abdominal pain     Past Surgical History:  Procedure Laterality Date   BREAST REDUCTION SURGERY Bilateral 10/27/2022   Procedure: bilateral breast reduction;  Surgeon: Santiago Glad, MD;  Location: [facility];  Service: Plastics;  Laterality: Bilateral;   CARPAL TUNNEL RELEASE     CESAREAN  SECTION     TUBAL LIGATION     tubal reversal      Family Psychiatric History: denies  Family History: History reviewed. No pertinent family history.  Social History:  Academic: currently in school Vocational: not currently employed  Social History   Socioeconomic History   Marital status: Single    Spouse name: Not on file   Number of children: Not on file   Years of education: Not on file   Highest education level: Not on file  Occupational History   Not on file  Tobacco Use   Smoking status: Never   Smokeless tobacco: Never  Vaping Use   Vaping status: Never Used  Substance and Sexual Activity   Alcohol use: Yes    Comment: once monthly   Drug use: No   Sexual activity: Yes    Birth control/protection: Surgical  Other Topics Concern   Not on file  Social History Narrative   Not on file   Social Determinants of Health   Financial Resource Strain: Not on file  Food Insecurity: Not on file  Transportation Needs: Not on file  Physical Activity: Inactive (01/11/2021)   [facility], [facility]   Exercise Vital Sign    Days of Exercise per Week: 0 days    Minutes of Exercise per Session: 0 min  Stress:  Stress Concern Present (01/11/2021)   Received [facility], [facility]   [facility] of Occupational Health - Occupational Stress Questionnaire    Feeling of Stress : Very much  Social Connections: Unknown (06/21/2021)   Received from [facility], [facility]   Social Network    Social Network: Not on file    Allergies:  Allergies  Allergen Reactions   Codeine Itching    Current Medications: Current Outpatient Medications  Medication Sig Dispense Refill   HYDROcodone-acetaminophen (NORCO) 10-325 MG tablet Take 1 tablet by mouth every 6 (six) hours as needed for moderate pain.     hydrOXYzine (ATARAX) 10 MG tablet Take 1-2 tablets (10-20 mg total) by mouth 2 (two) times daily as needed for anxiety (sleep). 90  tablet 2   letrozole (FEMARA) 2.5 MG tablet Take 2.5 mg by mouth daily. (Patient not taking: Reported on 11/17/2022)     QUEtiapine (SEROQUEL) 50 MG tablet Take 1 tablet (50 mg total) by mouth at bedtime. 30 tablet 2   No current facility-administered medications for this visit.    ROS: Reports fatigue  Objective:  Psychiatric Specialty Exam: Last menstrual period 09/28/2022.There is no height or weight on file to calculate BMI.  General Appearance: Casual and Well Groomed  Eye Contact:  Good  Speech:  Clear and Coherent and Normal Rate  Volume:  Normal  Mood:   "better"  Affect:   Euthymic; calm  Thought Content:  Denies AVH; no overt delusional thought content on interview    Suicidal Thoughts:  No  Homicidal Thoughts:  No  Thought Process:  Goal Directed and Linear  Orientation:  Full (Time, Place, and Person)    Memory:  Grossly intact  Judgment:  Good  Insight:  Fair  Concentration:  Concentration: Good  Recall:  not formally assessed  Fund of Knowledge: Good  Language: Good  Psychomotor Activity:  Normal  Akathisia:  No  AIMS (if indicated): not done  Assets:  Communication Skills Desire for Improvement Housing Leisure Time Physical Health Social Support Talents/Skills Transportation Vocational/Educational  ADL's:  Intact  Cognition: WNL  Sleep:   delayed phase   PE: General: sits comfortably in view of camera; no acute distress  Pulm: no increased work of breathing on room air  MSK: all extremity movements appear intact  Neuro: no focal neurological deficits observed  Gait & Station: unable to assess by video    Metabolic Disorder Labs: No results found for: "HGBA1C", "MPG" No results found for: "PROLACTIN" No results found for: "CHOL", "TRIG", "HDL", "CHOLHDL", "VLDL", "LDLCALC" No results found for: "TSH"  Therapeutic Level Labs: No results found for: "LITHIUM" No results found for: "VALPROATE" No results found for: "CBMZ"  Screenings:   GAD-7    Flowsheet Row Office Visit from 08/23/2022 in [facility]  Total GAD-7 Score 21      PHQ2-9    Flowsheet Row Office Visit from 08/23/2022 in [place]  PHQ-2 Total Score 6  PHQ-9 Total Score 23      Flowsheet Row Admission (Discharged) from 10/27/2022 in [facility] from 10/21/2022 in [facility]) Office Visit from 08/23/2022 in [facility]  C-SSRS RISK CATEGORY No Risk No Risk No Risk       Collaboration of Care: Collaboration of Care: Medication Management AEB active medication management, Psychiatrist AEB established with this provider, and Referral or follow-up with counselor/therapist AEB scheduled for  individual psychotherapy  Patient/Guardian was advised Release of Information must be obtained prior to any record release in order to collaborate their care with an outside provider. Patient/Guardian was advised if they have not already done so to contact the registration department to sign all necessary forms in order for [place] to release information regarding their care.   Consent: Patient/Guardian gives verbal consent for treatment and assignment of benefits for services provided during this visit. Patient/Guardian expressed understanding and agreed to proceed.   Televisit via video: I connected with patient on 11/17/22 at  9:30 AM EDT by a video enabled telemedicine application and verified that I am speaking with the correct person using two identifiers.  Location: Patient: parked car in [place] Provider: remote office in [place]   I discussed the limitations of evaluation and management by telemedicine and the availability of in person appointments. The patient expressed understanding and agreed to proceed.  I discussed the assessment and treatment plan with the patient. The patient was provided an opportunity to ask questions and all were  answered. The patient agreed with the plan and demonstrated an understanding of the instructions.   The patient was advised to call back or seek an in-person evaluation if the symptoms worsen or if the condition fails to improve as anticipated.  I provided 35 minutes dedicated to the care of this patient via video on the date of this encounter to include chart review, face-to-face time with the patient, medication management/counseling, documentation.  Zulema Pulaski A  11/17/2022, 11:20 AM

## 2022-11-17 ENCOUNTER — Encounter (HOSPITAL_COMMUNITY): Payer: Self-pay | Admitting: Psychiatry

## 2022-11-17 ENCOUNTER — Telehealth (HOSPITAL_COMMUNITY): Payer: Medicaid Other | Admitting: Psychiatry

## 2022-11-17 DIAGNOSIS — R0683 Snoring: Secondary | ICD-10-CM | POA: Diagnosis not present

## 2022-11-17 DIAGNOSIS — F331 Major depressive disorder, recurrent, moderate: Secondary | ICD-10-CM | POA: Diagnosis not present

## 2022-11-17 DIAGNOSIS — G4721 Circadian rhythm sleep disorder, delayed sleep phase type: Secondary | ICD-10-CM | POA: Diagnosis not present

## 2022-11-17 DIAGNOSIS — F411 Generalized anxiety disorder: Secondary | ICD-10-CM | POA: Insufficient documentation

## 2022-11-17 MED ORDER — QUETIAPINE FUMARATE 50 MG PO TABS: 50.0000 mg | ORAL_TABLET | Freq: Every day | ORAL | 2 refills | Status: AC

## 2022-11-17 MED ORDER — HYDROXYZINE HCL 10 MG PO TABS: 10.0000 mg | ORAL_TABLET | Freq: Two times a day (BID) | ORAL | 2 refills | Status: AC | PRN

## 2022-11-17 NOTE — Patient Instructions (Signed)
Thank you for attending your appointment today.  -- INCREASE hydroxyzine to 1-2 tablets at bedtime as needed for sleep -- Continue other medications as prescribed.  Please do not make any changes to medications without first discussing with your provider. If you are experiencing a psychiatric emergency, please call 911 or present to your nearest emergency department. Additional crisis, medication management, and therapy resources are included below.  [facility]  [address] [place], [place] [address] [phone] WALK-IN URGENT CARE 24/7 FOR ANYONE [address], [place], [place]  [phone] Fax: [phone] [email or web address] *Interpreters available *Accepts all insurance and uninsured for Urgent Care needs *Accepts Medicaid and uninsured for outpatient treatment (below)      ONLY FOR [facility]  Below:    Outpatient New Patient Assessment/Therapy Walk-ins:        Monday -Thursday 8am until slots are full.        Every Friday 1pm-4pm  (first come, first served)                   New Patient Psychiatry/Medication Management        Monday-Friday 8am-11am (first come, first served)               For all walk-ins we ask that you arrive by 7:15am, because patients will be seen in the order of arrival.

## 2022-11-22 ENCOUNTER — Encounter: Payer: Self-pay | Admitting: Surgical

## 2022-11-22 ENCOUNTER — Ambulatory Visit: Payer: Medicaid Other | Admitting: Surgical

## 2022-11-22 DIAGNOSIS — N62 Hypertrophy of breast: Secondary | ICD-10-CM

## 2022-11-22 DIAGNOSIS — Z9889 Other specified postprocedural states: Secondary | ICD-10-CM

## 2022-11-22 NOTE — Progress Notes (Signed)
Patient is a  38 y.o.-year-old female status post bilateral breast reduction with Dr.  Ladona Ridgel. Patient is 4 weeks postop.   Chaperone present on exam Bilateral NAC's are viable, bilateral breast incisions are intact. There is no erythema or cellulitic changes noted. No subcutaneous fluid collections noted with palpation.  She does have small wounds at the T-junction of bilateral breast.  There is no surrounding erythema or cellulitic changes.  All of the other breast incisions are intact and healing well.   A/P:  Recommend Vaseline and gauze to bilateral inframammary fold breast wounds.  Recommend continuing with compressive garment 24/7 until 6 weeks post-op,  avoiding strenuous activity/heavy lifting until 6 weeks post-op  Recommend following up in 2-3 weeks for final post-op appt All the patient's questions were answered to their content. Recommend calling with any questions or concerns.

## 2022-12-12 ENCOUNTER — Encounter (HOSPITAL_COMMUNITY): Payer: Self-pay

## 2022-12-13 ENCOUNTER — Encounter: Payer: Medicaid Other | Admitting: Surgical

## 2022-12-14 ENCOUNTER — Ambulatory Visit (INDEPENDENT_AMBULATORY_CARE_PROVIDER_SITE_OTHER): Payer: Medicaid Other | Admitting: Licensed Clinical Social Worker

## 2022-12-14 ENCOUNTER — Encounter (HOSPITAL_COMMUNITY): Payer: Self-pay

## 2022-12-14 DIAGNOSIS — Z008 Encounter for other general examination: Secondary | ICD-10-CM | POA: Insufficient documentation

## 2022-12-14 NOTE — Progress Notes (Signed)
Patient ID: Theresa Holt, female   DOB: 08/06/1985, 37 y.o.   MRN: [id number]   Virtual Visit via Video Note  I connected with Theresa Holt on 12/14/22 at 11:00 AM EST by a video enabled telemedicine application and verified that I am speaking with the correct person using two identifiers.  Location: Patient: [name]  Provider: Providers Home    I discussed the limitations of evaluation and management by telemedicine and the availability of in person appointments. The patient expressed understanding and agreed to proceed.   Patient was alert and oriented x 5.  She was pleasant and cooperative.  She started out session in her vehicle and you that she could not be driving and stated that she was attempting to park.  After patient parked patient reported that since starting to take her medications she does not feel like she did therapy.  LCSW explained the right to self-determination.  LCSW provided patient option to continue on with the CCA in case it was ever needed in the future.  Theresa decline.  LCSW explained that if she ever felt that therapy was needed she could be reassessed and rescheduled in the future.  Patient was thankful for the information and then disconnected.  LCSW secure chat in epic referring provider to notify her of why CCA  was not completed.   I discussed the assessment and treatment plan with the patient. The patient was provided an opportunity to ask questions and all were answered. The patient agreed with the plan and demonstrated an understanding of the instructions.   The patient was advised to call back or seek an in-person evaluation if the symptoms worsen or if the condition fails to improve as anticipated.  I provided 10 minutes of non-face-to-face time during this encounter.   Theresa Cooks, LCSW

## 2022-12-18 ENCOUNTER — Encounter: Payer: Medicaid Other | Admitting: Surgical

## 2022-12-18 NOTE — Progress Notes (Deleted)
Patient is a  37 y.o.-year-old female status post bilateral breast reduction with Dr.  Ladona Ridgel on 10/27/2022.  She is approximately 2 months postop.   Chaperone present on exam ***Bilateral NAC's are viable, bilateral breast incisions are intact. There is no erythema or cellulitic changes noted. No subcutaneous fluid collections noted with palpation.     Recommend continuing with compressive garment 24/7 until 6 weeks post-op,  avoiding strenuous activity/heavy lifting until 6 weeks post-op  Recommend following up in *** All the patient's questions were answered to their content. Recommend calling with any questions or concerns.

## 2022-12-28 ENCOUNTER — Other Ambulatory Visit (HOSPITAL_BASED_OUTPATIENT_CLINIC_OR_DEPARTMENT_OTHER): Payer: Self-pay

## 2022-12-28 ENCOUNTER — Encounter (HOSPITAL_BASED_OUTPATIENT_CLINIC_OR_DEPARTMENT_OTHER): Payer: Self-pay

## 2022-12-28 MED ORDER — WEGOVY 0.25 MG/0.5ML ~~LOC~~ SOAJ
0.2500 mg | SUBCUTANEOUS | 1 refills | Status: DC
Start: 2022-12-28 — End: 2023-10-11
  Filled 2022-12-28 (×2): qty 2, 28d supply, fill #0

## 2023-01-18 NOTE — Progress Notes (Signed)
Patient did not connect for virtual psychiatric medication management appointment on 01/19/23 at 9:30AM. Sent secure video link with no response. Left VM with callback number to reschedule.  Daine Gip, MD 01/19/23

## 2023-01-19 ENCOUNTER — Encounter (HOSPITAL_COMMUNITY): Payer: Self-pay

## 2023-01-19 ENCOUNTER — Encounter (HOSPITAL_COMMUNITY): Payer: Medicaid Other | Admitting: Psychiatry

## 2023-01-24 ENCOUNTER — Other Ambulatory Visit (HOSPITAL_BASED_OUTPATIENT_CLINIC_OR_DEPARTMENT_OTHER): Payer: Self-pay

## 2023-01-25 ENCOUNTER — Other Ambulatory Visit (HOSPITAL_BASED_OUTPATIENT_CLINIC_OR_DEPARTMENT_OTHER): Payer: Self-pay

## 2023-02-02 ENCOUNTER — Other Ambulatory Visit (HOSPITAL_BASED_OUTPATIENT_CLINIC_OR_DEPARTMENT_OTHER): Payer: Self-pay

## 2023-02-03 ENCOUNTER — Other Ambulatory Visit (HOSPITAL_BASED_OUTPATIENT_CLINIC_OR_DEPARTMENT_OTHER): Payer: Self-pay

## 2023-02-03 MED ORDER — SEMAGLUTIDE-WEIGHT MANAGEMENT 0.5 MG/0.5ML ~~LOC~~ SOAJ
0.5000 mg | SUBCUTANEOUS | 0 refills | Status: DC
  Filled 2023-02-03 – 2023-02-19 (×4): qty 2, 28d supply, fill #0

## 2023-02-05 ENCOUNTER — Other Ambulatory Visit (HOSPITAL_BASED_OUTPATIENT_CLINIC_OR_DEPARTMENT_OTHER): Payer: Self-pay

## 2023-02-15 ENCOUNTER — Other Ambulatory Visit (HOSPITAL_BASED_OUTPATIENT_CLINIC_OR_DEPARTMENT_OTHER): Payer: Self-pay

## 2023-02-19 ENCOUNTER — Other Ambulatory Visit (HOSPITAL_BASED_OUTPATIENT_CLINIC_OR_DEPARTMENT_OTHER): Payer: Self-pay

## 2023-03-16 ENCOUNTER — Other Ambulatory Visit (HOSPITAL_BASED_OUTPATIENT_CLINIC_OR_DEPARTMENT_OTHER): Payer: Self-pay

## 2023-03-17 ENCOUNTER — Other Ambulatory Visit (HOSPITAL_BASED_OUTPATIENT_CLINIC_OR_DEPARTMENT_OTHER): Payer: Self-pay

## 2023-03-20 ENCOUNTER — Other Ambulatory Visit (HOSPITAL_BASED_OUTPATIENT_CLINIC_OR_DEPARTMENT_OTHER): Payer: Self-pay

## 2023-03-20 MED ORDER — WEGOVY 1 MG/0.5ML ~~LOC~~ SOAJ
1.0000 mg | SUBCUTANEOUS | 2 refills | Status: DC
  Filled 2023-03-20: qty 2, 28d supply, fill #0
  Filled 2023-04-15: qty 2, 28d supply, fill #1

## 2023-04-16 ENCOUNTER — Other Ambulatory Visit (HOSPITAL_COMMUNITY): Payer: Self-pay

## 2023-04-16 ENCOUNTER — Other Ambulatory Visit: Payer: Self-pay

## 2023-04-17 ENCOUNTER — Other Ambulatory Visit: Payer: Self-pay

## 2023-04-18 ENCOUNTER — Other Ambulatory Visit (HOSPITAL_COMMUNITY): Payer: Self-pay

## 2023-04-23 DIAGNOSIS — H903 Sensorineural hearing loss, bilateral: Secondary | ICD-10-CM | POA: Insufficient documentation

## 2023-04-30 ENCOUNTER — Other Ambulatory Visit: Payer: Self-pay | Admitting: Physical Medicine and Rehabilitation

## 2023-04-30 DIAGNOSIS — H903 Sensorineural hearing loss, bilateral: Secondary | ICD-10-CM

## 2023-05-03 ENCOUNTER — Other Ambulatory Visit (HOSPITAL_BASED_OUTPATIENT_CLINIC_OR_DEPARTMENT_OTHER): Payer: Self-pay

## 2023-05-03 MED ORDER — WEGOVY 1.7 MG/0.75ML ~~LOC~~ SOAJ
1.7000 mg | SUBCUTANEOUS | 2 refills | Status: DC
  Filled 2023-05-03 – 2023-05-14 (×3): qty 3, 28d supply, fill #0
  Filled 2023-06-11: qty 3, 28d supply, fill #1
  Filled 2023-07-10: qty 3, 28d supply, fill #2

## 2023-05-14 ENCOUNTER — Other Ambulatory Visit (HOSPITAL_COMMUNITY): Payer: Self-pay

## 2023-05-14 ENCOUNTER — Other Ambulatory Visit: Payer: Self-pay

## 2023-05-15 ENCOUNTER — Other Ambulatory Visit (HOSPITAL_COMMUNITY): Payer: Self-pay

## 2023-05-15 ENCOUNTER — Other Ambulatory Visit: Payer: Self-pay

## 2023-05-15 ENCOUNTER — Other Ambulatory Visit (HOSPITAL_BASED_OUTPATIENT_CLINIC_OR_DEPARTMENT_OTHER): Payer: Self-pay

## 2023-06-03 ENCOUNTER — Other Ambulatory Visit

## 2023-06-11 ENCOUNTER — Other Ambulatory Visit (HOSPITAL_BASED_OUTPATIENT_CLINIC_OR_DEPARTMENT_OTHER): Payer: Self-pay

## 2023-06-11 ENCOUNTER — Other Ambulatory Visit (HOSPITAL_COMMUNITY): Payer: Self-pay

## 2023-07-10 ENCOUNTER — Other Ambulatory Visit: Payer: Self-pay

## 2023-07-20 ENCOUNTER — Other Ambulatory Visit (HOSPITAL_BASED_OUTPATIENT_CLINIC_OR_DEPARTMENT_OTHER): Payer: Self-pay

## 2023-07-20 MED ORDER — WEGOVY 1.7 MG/0.75ML ~~LOC~~ SOAJ
1.7000 mg | SUBCUTANEOUS | 2 refills | Status: DC
  Filled 2023-07-20 – 2023-08-28 (×4): qty 3, 28d supply, fill #0
  Filled 2023-09-21 – 2023-09-28 (×2): qty 3, 28d supply, fill #1
  Filled 2023-10-25: qty 3, 28d supply, fill #2

## 2023-07-23 ENCOUNTER — Encounter: Payer: Self-pay | Admitting: Physical Medicine and Rehabilitation

## 2023-07-25 ENCOUNTER — Other Ambulatory Visit

## 2023-08-06 ENCOUNTER — Other Ambulatory Visit (HOSPITAL_BASED_OUTPATIENT_CLINIC_OR_DEPARTMENT_OTHER): Payer: Self-pay

## 2023-08-28 ENCOUNTER — Other Ambulatory Visit (HOSPITAL_COMMUNITY): Payer: Self-pay

## 2023-08-28 ENCOUNTER — Other Ambulatory Visit (HOSPITAL_BASED_OUTPATIENT_CLINIC_OR_DEPARTMENT_OTHER): Payer: Self-pay

## 2023-09-03 ENCOUNTER — Institutional Professional Consult (permissible substitution): Admitting: Plastic Surgery

## 2023-09-18 ENCOUNTER — Other Ambulatory Visit: Payer: Self-pay

## 2023-09-18 ENCOUNTER — Encounter: Payer: Self-pay | Admitting: Emergency Medicine

## 2023-09-18 ENCOUNTER — Ambulatory Visit: Admission: EM | Admit: 2023-09-18 | Discharge: 2023-09-18 | Disposition: A | Discharge status: Home / Self Care

## 2023-09-18 DIAGNOSIS — U071 COVID-19: Secondary | ICD-10-CM | POA: Diagnosis not present

## 2023-09-18 DIAGNOSIS — J069 Acute upper respiratory infection, unspecified: Secondary | ICD-10-CM | POA: Diagnosis not present

## 2023-09-18 LAB — POC SOFIA SARS ANTIGEN FIA: SARS Coronavirus 2 Ag: POSITIVE — AB

## 2023-09-18 MED ORDER — BENZONATATE 100 MG PO CAPS: 100.0000 mg | ORAL_CAPSULE | Freq: Three times a day (TID) | ORAL | 0 refills | Status: DC | PRN

## 2023-09-18 NOTE — ED Triage Notes (Signed)
 Pt here for nasal congestion and sore throat x 3 days; pt sts home covid test positive yesterday but needs one from doctor for her job

## 2023-09-18 NOTE — Discharge Instructions (Addendum)
 Your COVID test is positive  Take benzonatate  100 mg, 1 tab every 8 hours as needed for cough.

## 2023-09-18 NOTE — ED Provider Notes (Addendum)
 [facility]    CSN: [id number] Arrival date & time: 09/18/23  9178      History   Chief Complaint Chief Complaint  Patient presents with   Nasal Congestion    HPI Theresa Holt is a 38 y.o. female.   HPI Here for nasal congestion and cough and sore throat.  She started having symptoms on August 10.  No fever or chills.  She is sore throat has actually gotten better yesterday and that is when the nasal congestion and rhinorrhea became more prominent.  She has been exposed to someone in the house who has COVID.  She did a home test that was positive for COVID.  Her job is requiring her to have a test from a healthcare entity.  She is allergic to codeine  Last menstrual cycle was July 30. 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   Fertility testing 03/03/2016   Hyperlipidemia 07/28/2011   Headache 03/11/2010   Bronchospasm 02/10/2010   Obesity 12/13/2009   Allergic rhinitis 10/19/2009   Fracture of phalanx of finger 09/30/2009   Fungal infection of foot 08/02/2009   Insomnia 08/02/2009   Need for HPV vaccination 08/02/2009   Obesity (BMI 30-39.9) 08/02/2009   Sorethroat 08/02/2009   Stress 08/02/2009   Pain in or around eye 10/15/2008    Past Surgical History:  Procedure Laterality Date   BREAST REDUCTION SURGERY Bilateral 10/27/2022   Procedure: bilateral breast reduction;  Surgeon: Waddell Leonce NOVAK, MD;  Location: [facility];  Service: Plastics;  Laterality: Bilateral;   CARPAL TUNNEL RELEASE     CESAREAN SECTION     TUBAL  LIGATION     tubal reversal      OB History   No obstetric history on file.      Home Medications    Prior to Admission medications   Medication Sig Start Date End Date Taking? Authorizing Provider  benzonatate  (TESSALON ) 100 MG capsule Take 1 capsule (100 mg total) by mouth 3 (three) times daily as needed for cough. 09/18/23  Yes Buford Gayler K, MD  norgestimate-ethinyl estradiol (ORTHO-CYCLEN) 0.25-35 MG-MCG tablet Take 1 tablet by mouth daily. 05/15/23  Yes [provider]  HYDROcodone -acetaminophen  (NORCO) 10-325 MG tablet Take 1 tablet by mouth every 6 (six) hours as needed for moderate pain.    [provider]  hydrOXYzine  (ATARAX ) 10 MG tablet Take 1-2 tablets (10-20 mg total) by mouth 2 (two) times daily as needed for anxiety (sleep). 11/17/22   Bahraini, Sarah A  letrozole (FEMARA) 2.5 MG tablet Take 2.5 mg by mouth daily.    [provider]  QUEtiapine  (SEROQUEL ) 50 MG tablet Take 1 tablet (50 mg total) by mouth at bedtime. 11/17/22   Bahraini, Sarah A  Semaglutide -Weight Management (WEGOVY ) 0.25 MG/0.5ML SOAJ Inject 0.25 mg into the skin once a week. 12/28/22     Semaglutide -Weight Management (WEGOVY ) 1 MG/0.5ML SOAJ Inject 1 mg into the skin once a week. 03/20/23     Semaglutide -Weight Management (WEGOVY ) 1.7 MG/0.75ML SOAJ Inject 1.7 mg into the skin every 7 (seven) days.  07/20/23     Semaglutide -Weight Management 0.5 MG/0.5ML SOAJ Inject 0.5 mg into the skin once a week. 02/02/23       Family History History reviewed. No pertinent family history.  Social History Social History   Tobacco Use   Smoking status: Never   Smokeless tobacco: Never  Vaping Use   Vaping status: Never Used  Substance Use Topics   Alcohol use: Yes    Comment: once monthly   Drug use: No     Allergies   Codeine   Review of Systems Review of Systems   Physical Exam Triage Vital Signs ED Triage Vitals  Encounter Vitals Group     BP 09/18/23 0900 (!)  148/82     Girls Systolic BP Percentile --      Girls Diastolic BP Percentile --      Boys Systolic BP Percentile --      Boys Diastolic BP Percentile --      Pulse Rate 09/18/23 0900 81     Resp 09/18/23 0900 18     Temp 09/18/23 0900 98.2 F (36.8 C)     Temp Source 09/18/23 0900 Oral     SpO2 09/18/23 0900 98 %     Weight --      Height --      Head Circumference --      Peak Flow --      Pain Score 09/18/23 0901 0     Pain Loc --      Pain Education --      Exclude from Growth Chart --    No data found.  Updated Vital Signs BP (!) 148/82 (BP Location: Left Arm)   Pulse 81   Temp 98.2 F (36.8 C) (Oral)   Resp 18   SpO2 98%   Visual Acuity Right Eye Distance:   Left Eye Distance:   Bilateral Distance:    Right Eye Near:   Left Eye Near:    Bilateral Near:     Physical Exam Vitals reviewed.  Constitutional:      General: She is not in acute distress.    Appearance: She is not ill-appearing, toxic-appearing or diaphoretic.  HENT:     Nose: Congestion present.     Mouth/Throat:     Mouth: Mucous membranes are moist.     Comments: There is white and clear exudate draining. Eyes:     Extraocular Movements: Extraocular movements intact.     Conjunctiva/sclera: Conjunctivae normal.     Pupils: Pupils are equal, round, and reactive to light.  Cardiovascular:     Rate and Rhythm: Normal rate and regular rhythm.     Heart sounds: No murmur heard. Pulmonary:     Effort: Pulmonary effort is normal. No respiratory distress.     Breath sounds: Normal breath sounds. No stridor. No wheezing, rhonchi or rales.  Musculoskeletal:     Cervical back: Neck supple.  Lymphadenopathy:     Cervical: No cervical adenopathy.  Skin:    Capillary Refill: Capillary refill takes less than 2 seconds.     Coloration: Skin is not jaundiced or pale.  Neurological:     General: No focal deficit present.     Mental Status: She is alert and oriented to person, place, and time.   Psychiatric:        Behavior: Behavior normal.      UC Treatments / Results  Labs (all labs ordered are listed, but only abnormal results are displayed) Labs Reviewed  POC SOFIA SARS ANTIGEN FIA - Abnormal; Notable for the following components:      Result Value   SARS Coronavirus 2 Ag Positive (*)    All other components within normal limits    EKG   Radiology No results found.  Procedures Procedures (including critical care time)  Medications Ordered in UC Medications - No data to display  Initial Impression / Assessment and Plan / UC Course  I have reviewed the triage vital signs and the nursing notes.  Pertinent labs & imaging results that were available during my care of the patient were reviewed by me and considered in my medical decision making (see chart for details).     COVID test is positive  Tessalon  Perles were sent in for cough   work note provided  Final Clinical Impressions(s) / UC Diagnoses   Final diagnoses:  Viral URI  COVID     Discharge Instructions      Your COVID test is positive  Take benzonatate  100 mg, 1 tab every 8 hours as needed for cough.      ED Prescriptions     Medication Sig Dispense Auth. Provider   benzonatate  (TESSALON ) 100 MG capsule Take 1 capsule (100 mg total) by mouth 3 (three) times daily as needed for cough. 21 capsule Troy Hartzog K, MD      PDMP not reviewed this encounter.   Vonna Sharlet POUR, MD 09/18/23 9070    Vonna Sharlet POUR, MD 09/18/23 [phone]

## 2023-09-21 ENCOUNTER — Other Ambulatory Visit (HOSPITAL_BASED_OUTPATIENT_CLINIC_OR_DEPARTMENT_OTHER): Payer: Self-pay

## 2023-09-28 ENCOUNTER — Other Ambulatory Visit (HOSPITAL_BASED_OUTPATIENT_CLINIC_OR_DEPARTMENT_OTHER): Payer: Self-pay

## 2023-10-11 ENCOUNTER — Encounter: Payer: Self-pay | Admitting: Plastic Surgery

## 2023-10-11 ENCOUNTER — Ambulatory Visit (INDEPENDENT_AMBULATORY_CARE_PROVIDER_SITE_OTHER): Admitting: Plastic Surgery

## 2023-10-11 VITALS — BP 137/91 | HR 82 | Wt 208.4 lb

## 2023-10-11 DIAGNOSIS — R21 Rash and other nonspecific skin eruption: Secondary | ICD-10-CM | POA: Diagnosis not present

## 2023-10-11 DIAGNOSIS — L905 Scar conditions and fibrosis of skin: Secondary | ICD-10-CM

## 2023-10-11 DIAGNOSIS — M6208 Separation of muscle (nontraumatic), other site: Secondary | ICD-10-CM

## 2023-10-11 DIAGNOSIS — M793 Panniculitis, unspecified: Secondary | ICD-10-CM | POA: Diagnosis not present

## 2023-10-11 NOTE — Progress Notes (Signed)
 Referring Provider Jerel Gee, NP [address] [place],  [place] [address]   CC:  Chief Complaint  Patient presents with   Consult      Theresa Holt is an 38 y.o. female.  HPI: Ms. Kraker is a 38 year old female well-known to me who underwent a bilateral breast reduction approximately a year ago.  She has done well from that and returns now requesting a panniculectomy.  She states that she has difficulty finding close that fit appropriately and that she often has to sweat under the pannus which results in frequent and ongoing rashes.  She has tried over-the-counter medications to treat these rashes unsuccessfully.  She is requesting surgical removal of the skin to help treat these ongoing problems.  She denies any new or significant medical problems.  Additionally she notes some fullness at the lateral part of her incisions which she is interested in having revised if possible.  Allergies  Allergen Reactions   Codeine Itching    Outpatient Encounter Medications as of 10/11/2023  Medication Sig   HYDROcodone -acetaminophen  (NORCO) 10-325 MG tablet Take 1 tablet by mouth every 6 (six) hours as needed for moderate pain.   hydrOXYzine  (ATARAX ) 10 MG tablet Take 1-2 tablets (10-20 mg total) by mouth 2 (two) times daily as needed for anxiety (sleep).   letrozole (FEMARA) 2.5 MG tablet Take 2.5 mg by mouth daily.   norgestimate-ethinyl estradiol (ORTHO-CYCLEN) 0.25-35 MG-MCG tablet Take 1 tablet by mouth daily.   QUEtiapine  (SEROQUEL ) 50 MG tablet Take 1 tablet (50 mg total) by mouth at bedtime.   semaglutide -weight management (WEGOVY ) 1.7 MG/0.75ML SOAJ SQ injection Inject 1.7 mg into the skin every 7 (seven) days.   [DISCONTINUED] benzonatate  (TESSALON ) 100 MG capsule Take 1 capsule (100 mg total) by mouth 3 (three) times daily as needed for cough. (Patient not taking: Reported on 10/11/2023)   [DISCONTINUED] Semaglutide -Weight Management (WEGOVY ) 0.25 MG/0.5ML SOAJ Inject 0.25 mg  into the skin once a week. (Patient not taking: Reported on 10/11/2023)   [DISCONTINUED] Semaglutide -Weight Management (WEGOVY ) 1 MG/0.5ML SOAJ Inject 1 mg into the skin once a week.   [DISCONTINUED] Semaglutide -Weight Management 0.5 MG/0.5ML SOAJ Inject 0.5 mg into the skin once a week.   No facility-administered encounter medications on file as of 10/11/2023.     Past Medical History:  Diagnosis Date   Anxiety    Chest pain    Depression    Fibromyalgia    Lower abdominal pain     Past Surgical History:  Procedure Laterality Date   BREAST REDUCTION SURGERY Bilateral 10/27/2022   Procedure: bilateral breast reduction;  Surgeon: Waddell Leonce NOVAK, MD;  Location: [facility];  Service: Plastics;  Laterality: Bilateral;   CARPAL TUNNEL RELEASE     CESAREAN SECTION     TUBAL LIGATION     tubal reversal      No family history on file.  Social History   Social History Narrative   Not on file     Review of Systems General: Denies fevers, chills, weight loss CV: Denies chest pain, shortness of breath, palpitations Breast: No complaints from the breast does have some fullness in the lateral aspects of both incisions.  She states this causes discomfort when she puts her arms down. Abdomen: Excess skin of fat on the anterior abdominal wall which frequently has rashes and is uncomfortable.  Physical Exam    10/11/2023    8:37 AM 09/18/2023    9:00 AM 10/30/2022    9:20 AM  Vitals with BMI  Weight 208 lbs 6 oz    Systolic 137 148 889  Diastolic 91 82 75  Pulse 82 81 77    General:  No acute distress,  Alert and oriented, Non-Toxic, Normal speech and affect Breast: Patient has very nice symmetric results from her breast reduction.  She does have a fullness along the distal portion of the incision into the axilla. Abdomen: Patient has a large pannus which extends well past the symphysis pubis and the inguinal creases onto the upper thigh.  She does not have Any rashes  today but she does have some discoloration on the posterior aspect of the pannus that is consistent with previous infections. Mammogram: Not applicable due to age Assessment/Plan Scars: Patient has a fullness at the distal portion of her scars.  I told her that we would submit this to insurance to see if they would be willing to allow her to undergo scar revision which would include a small amount of liposuction. 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  I showed her location of the incisions and we discussed the unpredictable nature of scarring and wound healing.  We discussed risk of bleeding, infection, seroma formation.  We discussed the fact that I will place 2 drains and these will be in place for 1 to 4 weeks postoperatively.  We discussed the importance of compression after surgery.  She will need to wear a compressive garment for 6 weeks.  We discussed postoperative limitations including no heavy lifting greater than 20 pounds, no vigorous activity, no submerging incisions underwater for 6 weeks.  She may return to light activity as tolerated and should be encouraged to begin ambulation immediately after surgery to help decrease the risk of DVT.  All questions were answered to her satisfaction.  Photographs were obtained today with her consent.  Will schedule her for a panniculectomy and possible revision of scars with liposuction at her request.  Leonce KATHEE [place] 10/11/2023, 3:19 PM

## 2023-10-18 ENCOUNTER — Other Ambulatory Visit: Payer: Self-pay

## 2023-10-18 ENCOUNTER — Encounter: Payer: Self-pay | Admitting: *Deleted

## 2023-10-18 ENCOUNTER — Ambulatory Visit
Admission: EM | Admit: 2023-10-18 | Discharge: 2023-10-18 | Disposition: A | Discharge status: Home / Self Care | Attending: Nurse Practitioner | Admitting: Nurse Practitioner

## 2023-10-18 DIAGNOSIS — J069 Acute upper respiratory infection, unspecified: Secondary | ICD-10-CM

## 2023-10-18 MED ORDER — FLUTICASONE PROPIONATE 50 MCG/ACT NA SUSP: 2.0000 | Freq: Every day | NASAL | 0 refills | Status: AC

## 2023-10-18 MED ORDER — CETIRIZINE-PSEUDOEPHEDRINE ER 5-120 MG PO TB12: 1.0000 | ORAL_TABLET | Freq: Every day | ORAL | 0 refills | Status: AC

## 2023-10-18 NOTE — Discharge Instructions (Addendum)

## 2023-10-18 NOTE — ED Triage Notes (Addendum)
 Pt reports runny nose and sore throat since yesterday. States It feels the same as when I had covid 3 weeks ago). Denies known fever. No meds today. Also c/o LL back pain, denies sx of UTI

## 2023-10-18 NOTE — ED Provider Notes (Signed)
 [facility]    CSN: [id number] Arrival date & time: 10/18/23  0809      History   Chief Complaint Chief Complaint  Patient presents with   Nasal Congestion   Sore Throat    HPI Theresa Holt is a 38 y.o. female.   38 year old female presenting with a acute onset of runny nose, sore throat, nasal congestion, sneezing and left lower back pain.  She denies any fevers, cough, diarrhea, nausea, vomiting, headache or bodyaches.  She took Tylenol  for her symptoms.  She has a recent history of COVID infection 1 month ago, diagnosed on 09/18/2023.  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  The following sections of the patient's history were reviewed and updated as appropriate: allergies, current medications, past family history, past medical history, past social history, past surgical history, and problem list.     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   Fertility testing 03/03/2016   Hyperlipidemia 07/28/2011   Headache 03/11/2010   Bronchospasm 02/10/2010   Obesity 12/13/2009   Allergic rhinitis 10/19/2009   Fracture of phalanx of finger 09/30/2009   Fungal infection of foot 08/02/2009   Insomnia 08/02/2009   Need for HPV vaccination 08/02/2009   Obesity (BMI 30-39.9) 08/02/2009   Sorethroat 08/02/2009   Stress 08/02/2009   Pain in or around eye 10/15/2008    Past Surgical History:  Procedure Laterality Date   BREAST REDUCTION SURGERY Bilateral 10/27/2022   Procedure: bilateral breast reduction;  Surgeon:  Waddell Leonce NOVAK, MD;  Location: [facility];  Service: Plastics;  Laterality: Bilateral;   CARPAL TUNNEL RELEASE     CESAREAN SECTION     TUBAL LIGATION     tubal reversal      OB History   No obstetric history on file.      Home Medications    Prior to Admission medications   Medication Sig Start Date End Date Taking? Authorizing Provider  cetirizine -pseudoephedrine  (ZYRTEC -D) 5-120 MG tablet Take 1 tablet by mouth daily with breakfast for 10 days. 10/18/23 10/28/23 Yes Iola Lukes, FNP  fluticasone  (FLONASE ) 50 MCG/ACT nasal spray Place 2 sprays into both nostrils daily. Shake well before use. Gently blow nose before spraying. Do not blow nose immediately after use. You should not taste the medication or feel it going down your throat; if you do, adjust your technique. 10/18/23  Yes Iola Lukes, FNP  HYDROcodone -acetaminophen  (NORCO) 10-325 MG tablet Take 1 tablet by mouth every 6 (six) hours as needed for moderate pain.   Yes [provider]  hydrOXYzine  (ATARAX ) 10 MG tablet Take 1-2 tablets (10-20 mg total) by mouth 2 (two) times daily as needed for anxiety (sleep). 11/17/22  Yes Bahraini, Sarah A  QUEtiapine  (SEROQUEL ) 50 MG tablet Take 1 tablet (50 mg total) by mouth at bedtime. 11/17/22  Yes [place]i, Sarah A  letrozole (FEMARA) 2.5 MG tablet Take 2.5 mg by mouth daily.    [provider]  norgestimate-ethinyl estradiol (ORTHO-CYCLEN) 0.25-35 MG-MCG tablet Take 1 tablet by mouth  daily. 05/15/23   [provider]  semaglutide -weight management (WEGOVY ) 1.7 MG/0.75ML SOAJ SQ injection Inject 1.7 mg into the skin every 7 (seven) days. 07/20/23       Family History History reviewed. No pertinent family history.  Social History Social History   Tobacco Use   Smoking status: Never   Smokeless tobacco: Never  Vaping Use   Vaping status: Never Used  Substance Use Topics   Alcohol use: Yes    Comment: once monthly   Drug use: No      Allergies   Codeine   Review of Systems Review of Systems  Constitutional:  Negative for fever.  HENT:  Positive for congestion, rhinorrhea, sneezing and sore throat.   Respiratory:  Negative for cough.   Gastrointestinal:  Negative for diarrhea, nausea and vomiting.  Musculoskeletal:  Positive for back pain (left lower). Negative for myalgias.  Neurological:  Negative for headaches.  All other systems reviewed and are negative.    Physical Exam Triage Vital Signs ED Triage Vitals  Encounter Vitals Group     BP 10/18/23 0900 (!) 153/77     Girls Systolic BP Percentile --      Girls Diastolic BP Percentile --      Boys Systolic BP Percentile --      Boys Diastolic BP Percentile --      Pulse Rate 10/18/23 0900 76     Resp 10/18/23 0900 16     Temp 10/18/23 0900 98 F (36.7 C)     Temp Source 10/18/23 0900 Oral     SpO2 10/18/23 0900 97 %     Weight --      Height --      Head Circumference --      Peak Flow --      Pain Score 10/18/23 0852 8     Pain Loc --      Pain Education --      Exclude from Growth Chart --    No data found.  Updated Vital Signs BP (!) 153/77 (BP Location: Left Arm)   Pulse 76   Temp 98 F (36.7 C) (Oral)   Resp 16   LMP 10/06/2023   SpO2 97%   Visual Acuity Right Eye Distance:   Left Eye Distance:   Bilateral Distance:    Right Eye Near:   Left Eye Near:    Bilateral Near:     Physical Exam Vitals reviewed.  Constitutional:      General: She is awake. She is not in acute distress.    Appearance: Normal appearance. She is well-developed. She is not ill-appearing, toxic-appearing or diaphoretic.  HENT:     Head: Normocephalic.     Right Ear: Tympanic membrane, ear canal and external ear normal. No drainage, swelling or tenderness. No middle ear effusion. Tympanic membrane is not erythematous.     Left Ear: Tympanic membrane, ear canal and external ear normal. No drainage, swelling or tenderness.  No middle ear effusion.  Tympanic membrane is not erythematous.     Nose: Congestion present. No rhinorrhea.     Mouth/Throat:     Lips: Pink.     Mouth: Mucous membranes are moist.     Pharynx: Oropharynx is clear. Uvula midline. No pharyngeal swelling, oropharyngeal exudate, posterior oropharyngeal erythema or uvula swelling.     Tonsils: No tonsillar exudate or tonsillar abscesses.  Eyes:     General: Vision grossly intact.     Conjunctiva/sclera: Conjunctivae normal.  Cardiovascular:  Rate and Rhythm: Normal rate.     Heart sounds: Normal heart sounds.  Pulmonary:     Effort: Pulmonary effort is normal. No tachypnea or respiratory distress.     Breath sounds: Normal breath sounds and air entry.  Musculoskeletal:        General: Normal range of motion.     Cervical back: Normal range of motion and neck supple.  Lymphadenopathy:     Cervical: No cervical adenopathy.  Skin:    General: Skin is warm and dry.  Neurological:     General: No focal deficit present.     Mental Status: She is alert and oriented to person, place, and time.  Psychiatric:        Behavior: Behavior is cooperative.      UC Treatments / Results  Labs (all labs ordered are listed, but only abnormal results are displayed) Labs Reviewed - No data to display  EKG   Radiology No results found.  Procedures Procedures (including critical care time)  Medications Ordered in UC Medications - No data to display  Initial Impression / Assessment and Plan / UC Course  I have reviewed the triage vital signs and the nursing notes.  Pertinent labs & imaging results that were available during my care of the patient were reviewed by me and considered in my medical decision making (see chart for details).     The patient presents with symptoms consistent with a viral upper respiratory infection. Exam is reassuring and no evidence of bacterial infection or acute cardiopulmonary process is noted. Supportive care is recommended.  Patient was advised to follow up with primary care if symptoms do not improve within one week or if new concerns arise. Instructions were given to seek emergency care if symptoms worsen, including shortness of breath, chest pain, persistent high fever, inability to tolerate fluids, or confusion.   Today's evaluation has revealed no signs of a dangerous process. Discussed diagnosis with patient and/or guardian. Patient and/or guardian aware of their diagnosis, possible red flag symptoms to watch out for and need for close follow up. Patient and/or guardian understands verbal and written discharge instructions. Patient and/or guardian comfortable with plan and disposition.  Patient and/or guardian has a clear mental status at this time, good insight into illness (after discussion and teaching) and has clear judgment to make decisions regarding their care  Documentation was completed with the aid of voice recognition software. Transcription may contain typographical errors.  Final Clinical Impressions(s) / UC Diagnoses   Final diagnoses:  Viral upper respiratory tract infection     Discharge Instructions      Your symptoms are most likely caused by a respiratory infection, which affects areas like your nose, throat, or lungs. This type of infection is usually caused by a virus. Since your illness is caused by a virus, antibiotics won't help because they only treat infections caused by bacteria.  Take the medications that were prescribed to you as directed. If you have a fever, headache, or body aches, you can also take Tylenol  or ibuprofen  to help you feel more comfortable. Be sure to drink plenty of fluids to stay hydrated--aim for enough to keep your urine a pale yellow color. This will also help to thin mucus and make it easier to clear from your body.   Using a cool mist humidifier at home to keep humidity levels above 50% can be helpful. You can also inhale steam for 10 to 15 minutes, 3 to 4  times  a day. This can be done by sitting in the bathroom with a hot shower running, or by using over-the-counter vapor shower tablets to help with nasal congestion. Try to avoid cool or dry air as much as possible. When you sleep, keep your head elevated to help reduce post-nasal drainage. Be sure to get enough rest every night to support your recovery.Don't forget to replace your toothbrush once you start feeling better.   It's normal for a cough to linger for several weeks after a respiratory illness, even after other symptoms have resolved. This happens because the airways remain irritated and take time to fully heal. As long as the cough gradually improves and there are no new concerning symptoms, this is part of the normal recovery process.  If your symptoms get worse or if you develop any new or concerning symptoms, go to the emergency room right away. If you're not feeling better in a few days, follow up with your primary care provider.            ED Prescriptions     Medication Sig Dispense Auth. Provider   cetirizine -pseudoephedrine  (ZYRTEC -D) 5-120 MG tablet Take 1 tablet by mouth daily with breakfast for 10 days. 10 tablet Iola Lukes, FNP   fluticasone  (FLONASE ) 50 MCG/ACT nasal spray Place 2 sprays into both nostrils daily. Shake well before use. Gently blow nose before spraying. Do not blow nose immediately after use. You should not taste the medication or feel it going down your throat; if you do, adjust your technique. 16 g Iola Lukes, FNP      PDMP not reviewed this encounter.   Iola [place], [place] 10/18/23 [phone]

## 2023-10-25 ENCOUNTER — Other Ambulatory Visit (HOSPITAL_BASED_OUTPATIENT_CLINIC_OR_DEPARTMENT_OTHER): Payer: Self-pay

## 2023-10-25 MED ORDER — WEGOVY 1.7 MG/0.75ML ~~LOC~~ SOAJ
1.7000 mg | SUBCUTANEOUS | 0 refills | Status: DC
  Filled 2023-10-25: qty 3, 28d supply, fill #0

## 2023-10-27 ENCOUNTER — Ambulatory Visit
Admission: EM | Admit: 2023-10-27 | Discharge: 2023-10-27 | Disposition: A | Discharge status: Home / Self Care | Attending: Emergency Medicine | Admitting: Emergency Medicine

## 2023-10-27 ENCOUNTER — Encounter: Payer: Self-pay | Admitting: *Deleted

## 2023-10-27 ENCOUNTER — Other Ambulatory Visit: Payer: Self-pay

## 2023-10-27 DIAGNOSIS — M5442 Lumbago with sciatica, left side: Secondary | ICD-10-CM | POA: Diagnosis present

## 2023-10-27 DIAGNOSIS — Z113 Encounter for screening for infections with a predominantly sexual mode of transmission: Secondary | ICD-10-CM | POA: Diagnosis present

## 2023-10-27 DIAGNOSIS — R350 Frequency of micturition: Secondary | ICD-10-CM

## 2023-10-27 LAB — POCT URINE DIPSTICK
Bilirubin, UA: NEGATIVE
Glucose, UA: NEGATIVE mg/dL
Ketones, POC UA: NEGATIVE mg/dL
Leukocytes, UA: NEGATIVE
Nitrite, UA: NEGATIVE
Protein Ur, POC: NEGATIVE mg/dL
Spec Grav, UA: 1.015
Urobilinogen, UA: 4 U/dL — AB
pH, UA: 6.5

## 2023-10-27 MED ORDER — IBUPROFEN 600 MG PO TABS: 600.0000 mg | ORAL_TABLET | Freq: Four times a day (QID) | ORAL | 0 refills | Status: AC | PRN

## 2023-10-27 NOTE — Discharge Instructions (Addendum)
 We are sending your urine sample off for culture to determine if you have infection. This can take 2-3 days. In the meantime, please drink water and avoid sugar/soda/caffeine drinks!  We will call you if anything on your swab returns positive. You can also see these results on MyChart. Please abstain from sexual intercourse until your results return.  For your back pain and sciatica, try the ibuprofen  3 times daily with meals. This is a great anti-inflammatory medicine.  Follow up with your primary care provider regarding symptoms.

## 2023-10-27 NOTE — ED Triage Notes (Signed)
 Pt presents with left side low back pain and urinary frequency x 3 days. States she had been dealing with left sciatic pain that radiated down her left leg but that has improved. Concerned for UTI and would like STI testing also

## 2023-10-27 NOTE — ED Provider Notes (Signed)
 [facility]    CSN: [id number] Arrival date & time: 10/27/23  1345      History   Chief Complaint Chief Complaint  Patient presents with   Back Pain   Urinary Frequency    HPI Theresa Holt is a 38 y.o. female.  Urinary frequency for a few days No dysuria, hematuria, flank pain, abd pain, fever Reports does not drink much water Also wants STD testing. Denies any vaginal symptoms -- no odor, itching, discharge   She's also had left side low back pain and sciatica for a while but it seems to be improving. Was on her feet all day today at work. 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   Fertility testing 03/03/2016   Hyperlipidemia 07/28/2011   Headache 03/11/2010   Bronchospasm 02/10/2010   Obesity 12/13/2009   Allergic rhinitis 10/19/2009   Fracture of phalanx of finger 09/30/2009   Fungal infection of foot 08/02/2009   Insomnia 08/02/2009   Need for HPV vaccination 08/02/2009   Obesity (BMI 30-39.9) 08/02/2009   Sorethroat 08/02/2009   Stress 08/02/2009   Pain in or around eye 10/15/2008    Past Surgical History:  Procedure Laterality Date   BREAST REDUCTION SURGERY Bilateral 10/27/2022   Procedure: bilateral breast reduction;  Surgeon: Waddell Leonce NOVAK, MD;  Location: [facility];  Service: Plastics;  Laterality: Bilateral;   CARPAL TUNNEL RELEASE     CESAREAN SECTION     TUBAL LIGATION     tubal reversal      OB History   No  obstetric history on file.      Home Medications    Prior to Admission medications   Medication Sig Start Date End Date Taking? Authorizing Provider  cetirizine -pseudoephedrine  (ZYRTEC -D) 5-120 MG tablet Take 1 tablet by mouth daily with breakfast for 10 days. 10/18/23 10/28/23 Yes Iola Lukes, FNP  fluticasone  (FLONASE ) 50 MCG/ACT nasal spray Place 2 sprays into both nostrils daily. Shake well before use. Gently blow nose before spraying. Do not blow nose immediately after use. You should not taste the medication or feel it going down your throat; if you do, adjust your technique. 10/18/23  Yes Iola Lukes, FNP  hydrOXYzine  (ATARAX ) 10 MG tablet Take 1-2 tablets (10-20 mg total) by mouth 2 (two) times daily as needed for anxiety (sleep). 11/17/22  Yes [place]i, Sarah A  ibuprofen  (ADVIL ) 600 MG tablet Take 1 tablet (600 mg total) by mouth every 6 (six) hours as needed. 10/27/23  Yes Pape Parson, [place], PA-C  norgestimate-ethinyl estradiol (ORTHO-CYCLEN) 0.25-35 MG-MCG tablet Take 1 tablet by mouth daily. 05/15/23  Yes [provider]  QUEtiapine  (SEROQUEL ) 50 MG tablet Take 1 tablet (50 mg total) by mouth at bedtime. 11/17/22  Yes [place]raini, Lauraine LABOR  semaglutide -weight management (WEGOVY ) 1.7 MG/0.75ML SOAJ SQ injection Inject 1.7 mg into the skin every 7 (seven) days. 07/20/23  Yes   letrozole (FEMARA) 2.5 MG tablet Take 2.5 mg by mouth daily. Patient not taking: Reported on 10/27/2023  [provider]  semaglutide -weight management (WEGOVY ) 1.7 MG/0.75ML SOAJ SQ injection Inject 1.7 mg into the skin once a week. 10/25/23       Family History History reviewed. No pertinent family history.  Social History Social History   Tobacco Use   Smoking status: Never   Smokeless tobacco: Never  Vaping Use   Vaping status: Never Used  Substance Use Topics   Alcohol use: Yes    Comment: once monthly   Drug use: No     Allergies   Codeine   Review of Systems Review  of Systems As per HPI  Physical Exam Triage Vital Signs ED Triage Vitals  Encounter Vitals Group     BP 10/27/23 1353 117/84     Girls Systolic BP Percentile --      Girls Diastolic BP Percentile --      Boys Systolic BP Percentile --      Boys Diastolic BP Percentile --      Pulse Rate 10/27/23 1353 81     Resp 10/27/23 1353 18     Temp 10/27/23 1353 98.4 F (36.9 C)     Temp Source 10/27/23 1353 Oral     SpO2 10/27/23 1353 100 %     Weight --      Height --      Head Circumference --      Peak Flow --      Pain Score 10/27/23 1406 0     Pain Loc --      Pain Education --      Exclude from Growth Chart --    No data found.  Updated Vital Signs BP 117/84 (BP Location: Left Arm)   Pulse 81   Temp 98.4 F (36.9 C) (Oral)   Resp 18   LMP 10/06/2023   SpO2 100%   Physical Exam Vitals and nursing note reviewed.  Constitutional:      General: She is not in acute distress. HENT:     Mouth/Throat:     Mouth: Mucous membranes are moist.     Pharynx: Oropharynx is clear.  Eyes:     Extraocular Movements: Extraocular movements intact.     Conjunctiva/sclera: Conjunctivae normal.     Pupils: Pupils are equal, round, and reactive to light.  Cardiovascular:     Rate and Rhythm: Normal rate and regular rhythm.     Heart sounds: Normal heart sounds.  Pulmonary:     Effort: Pulmonary effort is normal.     Breath sounds: Normal breath sounds.  Abdominal:     Palpations: Abdomen is soft.     Tenderness: There is no abdominal tenderness. There is no right CVA tenderness, left CVA tenderness or guarding.     Comments: No abdominal tenderness, no CVA tenderness   Musculoskeletal:     Cervical back: Normal range of motion. No rigidity or tenderness.     Lumbar back: Positive left straight leg raise test.     Comments: No bony tenderness C-L spine. + SLR on the left  Skin:    General: Skin is warm and dry.     Comments: No bruising, erythema, rash, lesions   Neurological:      General: No focal deficit present.     Mental Status: She is alert and oriented to person, place, and time.     Cranial Nerves: Cranial nerves 2-12 are intact. No cranial nerve deficit.     Sensory: Sensation is intact.     Motor: Motor  function is intact. No weakness.     Coordination: Coordination is intact.     Gait: Gait is intact.     Deep Tendon Reflexes: Reflexes are normal and symmetric.     Comments: Strength 5/5. Sensation equal, intact      UC Treatments / Results  Labs (all labs ordered are listed, but only abnormal results are displayed) Labs Reviewed  POCT URINE DIPSTICK - Abnormal; Notable for the following components:      Result Value   Clarity, UA cloudy (*)    Blood, UA trace-intact (*)    Urobilinogen, UA 4.0 (*)    All other components within normal limits  URINE CULTURE  CERVICOVAGINAL ANCILLARY ONLY    EKG  Radiology No results found.  Procedures Procedures   Medications Ordered in UC Medications - No data to display  Initial Impression / Assessment and Plan / UC Course  I have reviewed the triage vital signs and the nursing notes.  Pertinent labs & imaging results that were available during my care of the patient were reviewed by me and considered in my medical decision making (see chart for details).  Urinary frequency UA with trace blood. Will culture.  In the meantime discussed importance of water intake and decrease urinary irritants  STD screen Cytology swab pending for G/C/T Safe sex practices  Back pain, sciatica Neurologically intact. Stable vitals. Symptoms actually improving Ibuprofen  for pain, with food Monitor. PCP follow up Return and ED precautions   Agrees to plan, no questions at this time  Final Clinical Impressions(s) / UC Diagnoses   Final diagnoses:  Urinary frequency  Screen for STD (sexually transmitted disease)  Acute left-sided low back pain with left-sided sciatica     Discharge Instructions       We are sending your urine sample off for culture to determine if you have infection. This can take 2-3 days. In the meantime, please drink water and avoid sugar/soda/caffeine drinks!  We will call you if anything on your swab returns positive. You can also see these results on MyChart. Please abstain from sexual intercourse until your results return.  For your back pain and sciatica, try the ibuprofen  3 times daily with meals. This is a great anti-inflammatory medicine.  Follow up with your primary care provider regarding symptoms.      ED Prescriptions     Medication Sig Dispense Auth. Provider   ibuprofen  (ADVIL ) 600 MG tablet Take 1 tablet (600 mg total) by mouth every 6 (six) hours as needed. 30 tablet Taylee Gunnells, [place], PA-C      PDMP not reviewed this encounter.   Jeryl [place], PA-C 10/27/23 1434

## 2023-10-28 ENCOUNTER — Ambulatory Visit: Payer: Self-pay | Admitting: Emergency Medicine

## 2023-10-28 LAB — URINE CULTURE: Culture: NO GROWTH

## 2023-10-29 LAB — CERVICOVAGINAL ANCILLARY ONLY
Chlamydia: NEGATIVE
Comment: NEGATIVE
Comment: NEGATIVE
Comment: NORMAL
Neisseria Gonorrhea: NEGATIVE
Trichomonas: NEGATIVE

## 2023-11-02 ENCOUNTER — Other Ambulatory Visit (HOSPITAL_BASED_OUTPATIENT_CLINIC_OR_DEPARTMENT_OTHER): Payer: Self-pay | Admitting: Nurse Practitioner

## 2023-11-02 DIAGNOSIS — Z1231 Encounter for screening mammogram for malignant neoplasm of breast: Secondary | ICD-10-CM

## 2023-12-10 ENCOUNTER — Other Ambulatory Visit: Payer: Self-pay

## 2023-12-10 ENCOUNTER — Emergency Department (HOSPITAL_COMMUNITY)
Admission: EM | Admit: 2023-12-10 | Discharge: 2023-12-11 | Disposition: A | Discharge status: Home / Self Care | Attending: Emergency Medicine | Admitting: Emergency Medicine

## 2023-12-10 ENCOUNTER — Encounter (HOSPITAL_COMMUNITY): Payer: Self-pay

## 2023-12-10 ENCOUNTER — Emergency Department (HOSPITAL_COMMUNITY)

## 2023-12-10 DIAGNOSIS — R1013 Epigastric pain: Secondary | ICD-10-CM | POA: Insufficient documentation

## 2023-12-10 DIAGNOSIS — R1110 Vomiting, unspecified: Secondary | ICD-10-CM | POA: Insufficient documentation

## 2023-12-10 DIAGNOSIS — D649 Anemia, unspecified: Secondary | ICD-10-CM | POA: Diagnosis not present

## 2023-12-10 LAB — COMPREHENSIVE METABOLIC PANEL WITH GFR
ALT: 32 U/L (ref 0–44)
AST: 60 U/L — ABNORMAL HIGH (ref 15–41)
Albumin: 3.3 g/dL — ABNORMAL LOW (ref 3.5–5.0)
Alkaline Phosphatase: 82 U/L (ref 38–126)
Anion gap: 7 (ref 5–15)
BUN: 11 mg/dL (ref 6–20)
CO2: 24 mmol/L (ref 22–32)
Calcium: 8.6 mg/dL — ABNORMAL LOW (ref 8.9–10.3)
Chloride: 107 mmol/L (ref 98–111)
Creatinine, Ser: 1.02 mg/dL — ABNORMAL HIGH (ref 0.44–1.00)
GFR, Estimated: >60 mL/min (ref >60)
Glucose, Bld: 117 mg/dL — ABNORMAL HIGH (ref 70–99)
Potassium: 4 mmol/L (ref 3.5–5.1)
Sodium: 138 mmol/L (ref 135–145)
Total Bilirubin: 0.7 mg/dL (ref 0.0–1.2)
Total Protein: 7.1 g/dL (ref 6.5–8.1)

## 2023-12-10 LAB — URINALYSIS, ROUTINE W REFLEX MICROSCOPIC
Bilirubin Urine: NEGATIVE
Glucose, UA: NEGATIVE mg/dL
Hgb urine dipstick: NEGATIVE
Ketones, ur: NEGATIVE mg/dL
Leukocytes,Ua: NEGATIVE
Nitrite: NEGATIVE
Protein, ur: NEGATIVE mg/dL
Specific Gravity, Urine: 1.012 (ref 1.005–1.030)
pH: 9 — ABNORMAL HIGH (ref 5.0–8.0)

## 2023-12-10 LAB — CBC
HCT: 38.9 % (ref 36.0–46.0)
Hemoglobin: 11.9 g/dL — ABNORMAL LOW (ref 12.0–15.0)
MCH: 26.9 pg (ref 26.0–34.0)
MCHC: 30.6 g/dL (ref 30.0–36.0)
MCV: 87.8 fL (ref 80.0–100.0)
Platelets: 343 K/uL (ref 150–400)
RBC: 4.43 MIL/uL (ref 3.87–5.11)
RDW: 13.4 % (ref 11.5–15.5)
WBC: 13.8 K/uL — ABNORMAL HIGH (ref 4.0–10.5)
nRBC: 0 % (ref 0.0–0.2)

## 2023-12-10 LAB — I-STAT CG4 LACTIC ACID, ED: Lactic Acid, Venous: 1.2 mmol/L (ref 0.5–1.9)

## 2023-12-10 LAB — LIPASE, BLOOD: Lipase: 30 U/L (ref 11–51)

## 2023-12-10 LAB — HCG, SERUM, QUALITATIVE: Preg, Serum: NEGATIVE

## 2023-12-10 MED ORDER — IOHEXOL 350 MG/ML SOLN
75.0000 mL | Freq: Once | INTRAVENOUS | Status: AC | PRN
  Administered 2023-12-10: 75 mL via INTRAVENOUS

## 2023-12-10 MED ORDER — FENTANYL CITRATE (PF) 50 MCG/ML IJ SOSY
50.0000 ug | PREFILLED_SYRINGE | Freq: Once | INTRAMUSCULAR | Status: AC
  Administered 2023-12-10: 50 ug via INTRAVENOUS
  Filled 2023-12-10: qty 1

## 2023-12-10 MED ORDER — ONDANSETRON HCL 4 MG/2ML IJ SOLN
4.0000 mg | Freq: Once | INTRAMUSCULAR | Status: AC
  Administered 2023-12-10: 4 mg via INTRAVENOUS
  Filled 2023-12-10: qty 2

## 2023-12-10 MED ORDER — LACTATED RINGERS IV BOLUS
1000.0000 mL | Freq: Once | INTRAVENOUS | Status: AC
  Administered 2023-12-11: 1000 mL via INTRAVENOUS

## 2023-12-10 NOTE — ED Notes (Signed)
 Patient transported to CT

## 2023-12-10 NOTE — ED Provider Notes (Signed)
 [place] EMERGENCY DEPARTMENT AT [facility] Provider Note  MDM   HPI/ROS:  Theresa Holt is a 38 y.o. female with a PMH of fibromyalgia, MDD, anxiety, HLD who presents to the ED for abdominal pain. She states it began around 2:00 pm and acutely worsened while she was driving today with sharp epigastric tenderness assosciated with 1 episode of emesis. She describes her pain as sharp and epigastric. Denies hematemesis, melena, hematochezia.   Ddx includes but not limited to: pancreatitis, cholecystitis, obstruction, gastritis  On my initial evaluation, patient is:  -Vital signs stable. Patient afebrile, hemodynamically stable, and non-toxic appearing.  Physical exam is notable for: -epigastric TTP, no Murphy's or Mcburney's point tenderness - Abdomen is soft, nondistended, non rigid  Plan for labwork, CT abdomen pelvis, antiemetics, fluids and reassess.  Clinical Course as of 12/12/23 0841  Tue Dec 11, 2023  0015 CBC(!) There is evidence of leukocytosis.  Mild anemia. [CF]  0015 Comprehensive metabolic panel(!) Otherwise normal. [CF]  0015 Lipase, blood Negative. [CF]  0015 hCG, serum, qualitative Negative. [CF]  0015 Urinalysis, Routine w reflex microscopic -Urine, Clean Catch(!) Normal. [CF]  0015 I-Stat CG4 Lactic Acid Negative. [CF]  0015 CT ABDOMEN PELVIS W CONTRAST I personally ordered and interpreted the study.  This is negative.  Do agree with the radiologist interpretation. [CF]    Clinical Course User Index [CF] Theotis Cameron HERO, PA-C     Disposition:  At the time of handoff, disposition has not yet been determined. Sign-out was given to the oncoming providers.   Clinical Impression:  1. Epigastric abdominal pain    Clinical Complexity A medically appropriate history, review of systems, and physical exam was performed.  My independent interpretations of EKG, labs, and radiology are documented in the ED course above.   If decision rules were  used in this patient's evaluation, they are listed below.   Click here for ABCD2, HEART and other calculatorsREFRESH Note before signing   Patient's presentation is most consistent with acute complicated illness / injury requiring diagnostic workup.  Medical Decision Making Amount and/or Complexity of Data Reviewed Labs: ordered. Decision-making details documented in ED Course. Radiology: ordered. Decision-making details documented in ED Course.  Risk OTC drugs. Prescription drug management.    HPI/ROS      See MDM section for pertinent HPI and ROS. A complete ROS was performed with pertinent positives/negatives noted above.   Past Medical History:  Diagnosis Date   Anxiety    Chest pain    Depression    Fibromyalgia    Lower abdominal pain     Past Surgical History:  Procedure Laterality Date   BREAST REDUCTION SURGERY Bilateral 10/27/2022   Procedure: bilateral breast reduction;  Surgeon: Waddell Leonce NOVAK, MD;  Location: [facility];  Service: Plastics;  Laterality: Bilateral;   CARPAL TUNNEL RELEASE     CESAREAN SECTION     TUBAL LIGATION     tubal reversal        Physical Exam   Vitals:   12/10/23 2058 12/10/23 2100 12/10/23 2104  BP:  (!) 116/90 122/87  Pulse:  75   Resp:  18   Temp:  97.9 F (36.6 C)   TempSrc:  Oral   SpO2:  100%   Weight: 96.6 kg    Height: 5' 5 (1.651 m)      Physical Exam Vitals and nursing note reviewed.  Constitutional:      General: She is not in acute distress.  Appearance: She is well-developed.  HENT:     Head: Normocephalic and atraumatic.  Eyes:     Conjunctiva/sclera: Conjunctivae normal.  Cardiovascular:     Rate and Rhythm: Normal rate and regular rhythm.     Heart sounds: No murmur heard. Pulmonary:     Effort: Pulmonary effort is normal. No respiratory distress.     Breath sounds: Normal breath sounds.  Abdominal:     Palpations: Abdomen is soft.     Tenderness: There is abdominal  tenderness in the epigastric area.  Musculoskeletal:        General: No swelling.     Cervical back: Neck supple.  Skin:    General: Skin is warm and dry.     Capillary Refill: Capillary refill takes less than 2 seconds.  Neurological:     Mental Status: She is alert.  Psychiatric:        Mood and Affect: Mood normal.    Procedures   If procedures were preformed on this patient, they are listed below:  Procedures  Please note that this documentation was produced with the assistance of voice-to-text technology and may contain errors.     [name], MD 12/12/23 0847    Pamella Ozell LABOR, DO 12/18/23 [phone]

## 2023-12-10 NOTE — ED Triage Notes (Signed)
 Pt bib [facility] coming from home with CC of abdominal pain that began around 1400 today. Pt reportedly was driving to store when abdominal pain became 10/10. EMS states pain is epigastric and sharp. EMS reports difference in bp to each arm of about 20-30. Pt did vomit with EMS. GCS 15.   EMS VS: 148/84 65 HR 77 cbg NS  200mcg fentanyl   4mg  zofran 

## 2023-12-11 MED ORDER — FAMOTIDINE IN NACL 20-0.9 MG/50ML-% IV SOLN
20.0000 mg | Freq: Once | INTRAVENOUS | Status: AC
  Administered 2023-12-11: 20 mg via INTRAVENOUS
  Filled 2023-12-11: qty 50

## 2023-12-11 MED ORDER — FAMOTIDINE 20 MG PO TABS: 20.0000 mg | ORAL_TABLET | Freq: Two times a day (BID) | ORAL | 0 refills | Status: DC

## 2023-12-11 MED ORDER — LIDOCAINE VISCOUS HCL 2 % MT SOLN
15.0000 mL | Freq: Once | OROMUCOSAL | Status: AC
  Administered 2023-12-11: 15 mL via OROMUCOSAL
  Filled 2023-12-11: qty 15

## 2023-12-11 MED ORDER — ALUM & MAG HYDROXIDE-SIMETH 200-200-20 MG/5ML PO SUSP
30.0000 mL | Freq: Once | ORAL | Status: AC
  Administered 2023-12-11: 30 mL via ORAL
  Filled 2023-12-11: qty 30

## 2023-12-11 NOTE — ED Provider Notes (Signed)
  Physical Exam  BP 139/84   Pulse 72   Temp 97.9 F (36.6 C) (Oral)   Resp 17   Ht 5' 5 (1.651 m)   Wt 96.6 kg   SpO2 100%   BMI 35.45 kg/m   Physical Exam Vitals and nursing note reviewed.  Constitutional:      General: She is not in acute distress.    Appearance: Normal appearance.  HENT:     Head: Normocephalic and atraumatic.  Eyes:     General:        Right eye: No discharge.        Left eye: No discharge.  Cardiovascular:     Comments: Regular rate and rhythm.  S1/S2 are distinct without any evidence of murmur, rubs, or gallops.  Radial pulses are 2+ bilaterally.  Dorsalis pedis pulses are 2+ bilaterally.  No evidence of pedal edema. Pulmonary:     Comments: Clear to auscultation bilaterally.  Normal effort.  No respiratory distress.  No evidence of wheezes, rales, or rhonchi heard throughout. Abdominal:     General: Abdomen is flat. Bowel sounds are normal. There is no distension.     Tenderness: There is abdominal tenderness in the epigastric area. There is no guarding or rebound.  Musculoskeletal:        General: Normal range of motion.     Cervical back: Neck supple.  Skin:    General: Skin is warm and dry.     Findings: No rash.  Neurological:     General: No focal deficit present.     Mental Status: She is alert.  Psychiatric:        Mood and Affect: Mood normal.        Behavior: Behavior normal.     Procedures  Procedures  ED Course / MDM   Clinical Course as of 12/11/23 0016  Tue Dec 11, 2023  0015 CBC(!) There is evidence of leukocytosis.  Mild anemia. [CF]  0015 Comprehensive metabolic panel(!) Otherwise normal. [CF]  0015 Lipase, blood Negative. [CF]  0015 hCG, serum, qualitative Negative. [CF]  0015 Urinalysis, Routine w reflex microscopic -Urine, Clean Catch(!) Normal. [CF]  0015 I-Stat CG4 Lactic Acid Negative. [CF]  0015 CT ABDOMEN PELVIS W CONTRAST I personally ordered and interpreted the study.  This is negative.  Do agree with  the radiologist interpretation. [CF]    Clinical Course User Index [CF] Theotis Cameron HERO, PA-C   Medical Decision Making Amount and/or Complexity of Data Reviewed Labs: ordered. Decision-making details documented in ED Course. Radiology: ordered. Decision-making details documented in ED Course.  Risk OTC drugs. Prescription drug management.   Accepted handoff at shift change from Almousa MD . Please see prior provider note for more detail.   Briefly: Patient is 38 y.o. female who presents to the emergency department today for further evaluation of epigastric abdominal pain that started around the 2:00 today.  She had 1 episode of vomiting.  Denies any diarrhea, fever, chills.  Describes pain as sharp.  DDX: concern for possible  hepatobiliary pathology.  Concerning for pancreatitis as well.  Plan: Workup as normal up to this point.  Will give her GI cocktail, Pepcid , fluids and plan to discharge home.  Patient agreeable with plan.  On repeat evaluation, patient feeling better.  Will plan to discharge post fluids.            Theotis Cameron Browning, PA-C 12/11/23 9883    Bari Charmaine FALCON, MD 12/13/23 [phone]

## 2023-12-11 NOTE — Discharge Instructions (Signed)
 I would like for you to follow-up with your primary care doctor for further evaluation.  Please take Pepcid  as prescribed.  You can return to the emergency department for any worsening symptoms.

## 2024-01-29 ENCOUNTER — Emergency Department (HOSPITAL_COMMUNITY)

## 2024-01-29 ENCOUNTER — Encounter (HOSPITAL_COMMUNITY): Payer: Self-pay | Admitting: Emergency Medicine

## 2024-01-29 ENCOUNTER — Other Ambulatory Visit: Payer: Self-pay

## 2024-01-29 ENCOUNTER — Emergency Department (HOSPITAL_COMMUNITY)
Admission: EM | Admit: 2024-01-29 | Discharge: 2024-01-29 | Discharge status: Against Medical Advice | Attending: Emergency Medicine | Admitting: Emergency Medicine

## 2024-01-29 ENCOUNTER — Emergency Department (HOSPITAL_COMMUNITY)
Admission: EM | Admit: 2024-01-29 | Discharge: 2024-01-30 | Discharge status: Against Medical Advice | Attending: Emergency Medicine | Admitting: Emergency Medicine

## 2024-01-29 ENCOUNTER — Encounter (HOSPITAL_COMMUNITY): Payer: Self-pay

## 2024-01-29 DIAGNOSIS — Z5321 Procedure and treatment not carried out due to patient leaving prior to being seen by health care provider: Secondary | ICD-10-CM | POA: Insufficient documentation

## 2024-01-29 DIAGNOSIS — D72829 Elevated white blood cell count, unspecified: Secondary | ICD-10-CM | POA: Diagnosis not present

## 2024-01-29 DIAGNOSIS — R1013 Epigastric pain: Secondary | ICD-10-CM | POA: Diagnosis present

## 2024-01-29 DIAGNOSIS — R109 Unspecified abdominal pain: Secondary | ICD-10-CM | POA: Insufficient documentation

## 2024-01-29 DIAGNOSIS — R799 Abnormal finding of blood chemistry, unspecified: Secondary | ICD-10-CM | POA: Diagnosis present

## 2024-01-29 LAB — URINALYSIS, ROUTINE W REFLEX MICROSCOPIC
Bilirubin Urine: NEGATIVE
Glucose, UA: NEGATIVE mg/dL
Hgb urine dipstick: NEGATIVE
Ketones, ur: NEGATIVE mg/dL
Leukocytes,Ua: NEGATIVE
Nitrite: NEGATIVE
Protein, ur: NEGATIVE mg/dL
Specific Gravity, Urine: 1.024 (ref 1.005–1.030)
pH: 7 (ref 5.0–8.0)

## 2024-01-29 LAB — CBC
HCT: 42 % (ref 36.0–46.0)
Hemoglobin: 13.1 g/dL (ref 12.0–15.0)
MCH: 27.4 pg (ref 26.0–34.0)
MCHC: 31.2 g/dL (ref 30.0–36.0)
MCV: 87.9 fL (ref 80.0–100.0)
Platelets: 334 K/uL (ref 150–400)
RBC: 4.78 MIL/uL (ref 3.87–5.11)
RDW: 13.5 % (ref 11.5–15.5)
WBC: 11.8 K/uL — ABNORMAL HIGH (ref 4.0–10.5)
nRBC: 0 % (ref 0.0–0.2)

## 2024-01-29 LAB — TROPONIN T, HIGH SENSITIVITY: Troponin T High Sensitivity: <15 ng/L (ref 0–19)

## 2024-01-29 LAB — COMPREHENSIVE METABOLIC PANEL WITH GFR
ALT: 299 U/L — ABNORMAL HIGH (ref 0–44)
AST: 484 U/L — ABNORMAL HIGH (ref 15–41)
Albumin: 4.1 g/dL (ref 3.5–5.0)
Alkaline Phosphatase: 145 U/L — ABNORMAL HIGH (ref 38–126)
Anion gap: 11 (ref 5–15)
BUN: 10 mg/dL (ref 6–20)
CO2: 25 mmol/L (ref 22–32)
Calcium: 10 mg/dL (ref 8.9–10.3)
Chloride: 101 mmol/L (ref 98–111)
Creatinine, Ser: 0.97 mg/dL (ref 0.44–1.00)
GFR, Estimated: >60 mL/min
Glucose, Bld: 78 mg/dL (ref 70–99)
Potassium: 4.1 mmol/L (ref 3.5–5.1)
Sodium: 137 mmol/L (ref 135–145)
Total Bilirubin: 0.8 mg/dL (ref 0.0–1.2)
Total Protein: 8.1 g/dL (ref 6.5–8.1)

## 2024-01-29 LAB — HCG, SERUM, QUALITATIVE: Preg, Serum: NEGATIVE

## 2024-01-29 LAB — LIPASE, BLOOD: Lipase: 34 U/L (ref 11–51)

## 2024-01-29 MED ORDER — ONDANSETRON 4 MG PO TBDP
4.0000 mg | ORAL_TABLET | Freq: Once | ORAL | Status: AC
  Administered 2024-01-29: 4 mg via ORAL
  Filled 2024-01-29: qty 1

## 2024-01-29 MED ORDER — DICYCLOMINE HCL 10 MG PO CAPS
10.0000 mg | ORAL_CAPSULE | Freq: Once | ORAL | Status: AC
  Administered 2024-01-29: 10 mg via ORAL
  Filled 2024-01-29: qty 1

## 2024-01-29 MED ORDER — HYDROCODONE-ACETAMINOPHEN 5-325 MG PO TABS
1.0000 | ORAL_TABLET | Freq: Once | ORAL | Status: AC
  Administered 2024-01-29: 1 via ORAL
  Filled 2024-01-29: qty 1

## 2024-01-29 MED ORDER — ALUM & MAG HYDROXIDE-SIMETH 200-200-20 MG/5ML PO SUSP
30.0000 mL | Freq: Once | ORAL | Status: AC
  Administered 2024-01-29: 30 mL via ORAL
  Filled 2024-01-29: qty 30

## 2024-01-29 NOTE — ED Provider Triage Note (Signed)
 Emergency Medicine Provider Triage Evaluation Note  Theresa Holt , a 38 y.o. female  was evaluated in triage.  Pt complains of abdominal pain.  Seen earlier but left prior to completion of her workup.  After her blood work resulted it was found that her LFTs were elevated so she was called back..  Review of Systems  Positive: As above Negative: As above  Physical Exam  BP 127/89 (BP Location: Right Arm)   Pulse 92   Temp 98.2 F (36.8 C) (Oral)   Resp 16   LMP 12/24/2023   SpO2 100%  Gen:   Awake, no distress   Resp:  Normal effort  MSK:   Moves extremities without difficulty  Other:   Medical Decision Making  Medically screening exam initiated at 8:53 PM.  Appropriate orders placed.  Theresa Holt was informed that the remainder of the evaluation will be completed by another provider, this initial triage assessment does not replace that evaluation, and the importance of remaining in the ED until their evaluation is complete.  Will order right upper quadrant ultrasound.  Will repeat labs given the lobby is packed and that may take a while for her to get back to us  to trend labs.Theresa Holt, [place] 01/29/24 2054

## 2024-01-29 NOTE — ED Provider Triage Note (Signed)
 Emergency Medicine Provider Triage Evaluation Note  Theresa Holt , a 38 y.o. female  was evaluated in triage.  Pt complains of epigastric abdominal pain and chest pain with associated nausea.  Patient states that she has a history of reflux and felt like she was experiencing severe reflux symptoms this morning.  She endorses burning epigastric discomfort radiating up to her chest and has a history of the same.  The chest discomfort has resolved but she still endorses epigastric discomfort with associated severe nausea.  She denies any vomiting, no fevers or chills.  She was feeling lightheaded headedness and felt as if she was going to pass out.  Review of Systems  Positive: Lightheaded, CP, Abd Pain Negative: Fever, Chills  Physical Exam  BP (!) 135/96   Pulse 80   Temp 98 F (36.7 C)   Resp 16   Ht 5' 5 (1.651 m)   Wt 96.6 kg   LMP 12/24/2023   SpO2 98%   BMI 35.45 kg/m  Gen:   Awake, no distress   Resp:  Normal effort, lungs CTAB MSK:   Moves extremities without difficulty  Abd:  Soft, nontender, no rebound or guarding  Medical Decision Making  Medically screening exam initiated at 8:32 AM.  Appropriate orders placed.  Magalene Wynder was informed that the remainder of the evaluation will be completed by another provider, this initial triage assessment does not replace that evaluation, and the importance of remaining in the ED until their evaluation is complete.  Workup for CP and epigastric abd pain initiated from triage to include EKG and screening labs.   Jerrol Agent, MD 01/29/24 [phone]

## 2024-01-29 NOTE — ED Notes (Signed)
 Pt called *3 by tech and * 3 by ultrasound tech no answer

## 2024-01-29 NOTE — ED Notes (Signed)
 Pt states she came early with c/o heartburn and left without been seen she had a call to come back to follow up on abnormal liver enzymes. Pt denies any pain at this time.

## 2024-01-29 NOTE — ED Provider Notes (Signed)
.  Ultrasound ED Peripheral IV (Provider)  Date/Time: 01/29/2024 1:51 PM  Performed by: Neldon Hamp RAMAN, PA Authorized by: Neldon Hamp RAMAN, PA   Procedure details:    Indications: multiple failed IV attempts     Skin Prep: chlorhexidine gluconate     Location:  Left AC   Angiocath:  20 G   Bedside Ultrasound Guided: Yes     Images: not archived     Patient tolerated procedure without complications: Yes     Dressing applied: Yes       Neldon Hamp RAMAN, PA 01/29/24 1351    Garrick Charleston, MD 01/29/24 1554

## 2024-01-29 NOTE — Discharge Instructions (Addendum)
 Patient left AMA.  I contacted you to review your lab work.  I recommend that you follow back up in the ER.  I have also sent in a GI referral for you to follow-up with.

## 2024-01-29 NOTE — ED Triage Notes (Signed)
 Pt complaining of abdominal pain that started at 2 am this morning. Is in the epigastrium. [place] that she gets heartburn that makes her feel like passing out. Pt said that this episode is making her arms feel numb and she is nauseous.

## 2024-01-29 NOTE — ED Triage Notes (Signed)
 Quick triage note: Pt to ED c/o abnormal labs, came to ED this morning for abdominal pain, but eloped, was notified by EDP to come back for further evaluation d/t elevated LFTS. Denies pain.

## 2024-01-29 NOTE — ED Provider Notes (Signed)
" [place] EMERGENCY DEPARTMENT AT [facility] Provider Note   CSN: [id number] Arrival date & time: 01/29/24  9347     Patient presents with: Abdominal Pain   Theresa Holt is a 38 y.o. female.    Abdominal Pain 38 year old female presenting with epigastric pain.  Patient reports that she has a history of of GERD.  That she is experiencing right now is similar to the pain that she experiences with that.  She says that she has usually 2 flareups of this a month.  She has not gone to see a GYN doctor yet.  She has followed up with her primary care about this.  She denies any chest pain or shortness of breath right now.  She does not notice that it changes what she is eating.    Prior to Admission medications  Medication Sig Start Date End Date Taking? Authorizing Provider  famotidine  (PEPCID ) 20 MG tablet Take 1 tablet (20 mg total) by mouth 2 (two) times daily. 12/11/23   Theotis Peers M, PA-C  fluticasone  (FLONASE ) 50 MCG/ACT nasal spray Place 2 sprays into both nostrils daily. Shake well before use. Gently blow nose before spraying. Do not blow nose immediately after use. You should not taste the medication or feel it going down your throat; if you do, adjust your technique. 10/18/23   Iola Lukes, FNP  hydrOXYzine  (ATARAX ) 10 MG tablet Take 1-2 tablets (10-20 mg total) by mouth 2 (two) times daily as needed for anxiety (sleep). 11/17/22   Bahraini, Sarah A  ibuprofen  (ADVIL ) 600 MG tablet Take 1 tablet (600 mg total) by mouth every 6 (six) hours as needed. 10/27/23   Rising, [place], PA-C  letrozole (FEMARA) 2.5 MG tablet Take 2.5 mg by mouth daily. Patient not taking: Reported on 10/27/2023    [provider]  norgestimate-ethinyl estradiol (ORTHO-CYCLEN) 0.25-35 MG-MCG tablet Take 1 tablet by mouth daily. 05/15/23   [provider]  QUEtiapine  (SEROQUEL ) 50 MG tablet Take 1 tablet (50 mg total) by mouth at bedtime. 11/17/22   Bahraini, Sarah A   semaglutide -weight management (WEGOVY ) 1.7 MG/0.75ML SOAJ SQ injection Inject 1.7 mg into the skin every 7 (seven) days. 07/20/23     semaglutide -weight management (WEGOVY ) 1.7 MG/0.75ML SOAJ SQ injection Inject 1.7 mg into the skin once a week. 10/25/23       Allergies: Codeine    Review of Systems  Gastrointestinal:  Positive for abdominal pain.  All other systems reviewed and are negative.   Updated Vital Signs BP 139/71 (BP Location: Left Arm)   Pulse 62   Temp 98 F (36.7 C) (Oral)   Resp 17   Ht 5' 5 (1.651 m)   Wt 96.6 kg   LMP 12/24/2023   SpO2 100%   BMI 35.45 kg/m   Physical Exam Vitals and nursing note reviewed.  HENT:     Mouth/Throat:     Pharynx: Oropharynx is clear.  Cardiovascular:     Rate and Rhythm: Normal rate.     Pulses: Normal pulses.  Pulmonary:     Effort: Pulmonary effort is normal.     Breath sounds: Normal breath sounds.  Chest:     Comments: Denies any tenderness to palpation. Abdominal:     General: Abdomen is flat. Bowel sounds are normal.     Palpations: Abdomen is soft.     Tenderness: There is no guarding or rebound. Negative signs include Murphy's sign, Rovsing's sign and McBurney's sign.     Comments:  Tenderness to palpation in the epigastric area.  Skin:    General: Skin is warm and dry.  Neurological:     General: No focal deficit present.     Mental Status: She is alert.     (all labs ordered are listed, but only abnormal results are displayed) Labs Reviewed  COMPREHENSIVE METABOLIC PANEL WITH GFR - Abnormal; Notable for the following components:      Result Value   AST 484 (*)    ALT 299 (*)    Alkaline Phosphatase 145 (*)    All other components within normal limits  CBC - Abnormal; Notable for the following components:   WBC 11.8 (*)    All other components within normal limits  LIPASE, BLOOD  URINALYSIS, ROUTINE W REFLEX MICROSCOPIC  HCG, SERUM, QUALITATIVE  TROPONIN T, HIGH SENSITIVITY  TROPONIN T, HIGH  SENSITIVITY    EKG: EKG Interpretation Date/Time:  Tuesday January 29 2024 07:14:02 EST Ventricular Rate:  75 PR Interval:  138 QRS Duration:  82 QT Interval:  390 QTC Calculation: 435 R Axis:   39  Text Interpretation: Normal sinus rhythm Low voltage QRS Baseline wander Confirmed by Garrick Charleston [phone]) on 01/29/2024 1:16:55 PM  Radiology: No results found.   Procedures   Medications Ordered in the ED  ondansetron  (ZOFRAN -ODT) disintegrating tablet 4 mg (4 mg Oral Given 01/29/24 0759)  HYDROcodone -acetaminophen  (NORCO/VICODIN) 5-325 MG per tablet 1 tablet (1 tablet Oral Given 01/29/24 0859)  alum & mag hydroxide-simeth (MAALOX/MYLANTA) 200-200-20 MG/5ML suspension 30 mL (30 mLs Oral Given 01/29/24 1308)  dicyclomine  (BENTYL ) capsule 10 mg (10 mg Oral Given 01/29/24 1308)                                    Medical Decision Making Amount and/or Complexity of Data Reviewed Labs: ordered.  Risk Prescription drug management.   Impression: 38 year old female presenting with epigastric pain.  Differential diagnoses include GERD, pancreatitis, MI, cholelithiasis, cholecystitis  Additional History: Patient was able to provide history.  I also reviewed other outpatient visits.  Labs: CMP shows an elevated ALT at 299 and AST at 484 and alkaline phosphate at 145.  Urinalysis showed no abnormalities.  Troponin was less than 15.  CBC had a elevated white blood cell count at 11.8.  Imaging: None  ED Course/Meds: Patient was given Mylanta, Bentyl , Zofran , and Norco for pain.  After receiving that she says she started to feel a little bit better.  Before I could discuss labs with patient she eloped.  When talking with the patient the main thing she says she wanted from this appointment was a GI referral and a GI cocktail. I reach back out to the patient via phone call and gave her her lab results.  I recommended that she return to the ER for further workup.  She reported that she  needed to get her grandchild.  She said that she may be able to come back after that.  I still sent in a GI referral for her to follow-up with.      Final diagnoses:  None    ED Discharge Orders          Ordered    Ambulatory referral to Gastroenterology        01/29/24 1414               Rosaline Almarie KANDICE DEVONNA 01/29/24 1417    Garrick Charleston, MD  01/29/24 1557 ° °"

## 2024-01-30 NOTE — ED Notes (Signed)
 Called for pt, no answer

## 2024-01-31 ENCOUNTER — Other Ambulatory Visit: Payer: Self-pay

## 2024-01-31 ENCOUNTER — Emergency Department (HOSPITAL_COMMUNITY)

## 2024-01-31 ENCOUNTER — Emergency Department (HOSPITAL_COMMUNITY): Admission: EM | Admit: 2024-01-31 | Discharge: 2024-01-31 | Disposition: A | Discharge status: Home / Self Care

## 2024-01-31 ENCOUNTER — Encounter (HOSPITAL_COMMUNITY): Payer: Self-pay

## 2024-01-31 DIAGNOSIS — R7401 Elevation of levels of liver transaminase levels: Secondary | ICD-10-CM | POA: Insufficient documentation

## 2024-01-31 DIAGNOSIS — R1013 Epigastric pain: Secondary | ICD-10-CM | POA: Diagnosis not present

## 2024-01-31 DIAGNOSIS — R0789 Other chest pain: Secondary | ICD-10-CM | POA: Insufficient documentation

## 2024-01-31 LAB — HEPATIC FUNCTION PANEL
ALT: 182 U/L — ABNORMAL HIGH (ref 0–44)
AST: 78 U/L — ABNORMAL HIGH (ref 15–41)
Albumin: 4 g/dL (ref 3.5–5.0)
Alkaline Phosphatase: 126 U/L (ref 38–126)
Bilirubin, Direct: 0.1 mg/dL (ref 0.0–0.2)
Indirect Bilirubin: 0.3 mg/dL (ref 0.3–0.9)
Total Bilirubin: 0.4 mg/dL (ref 0.0–1.2)
Total Protein: 7.7 g/dL (ref 6.5–8.1)

## 2024-01-31 LAB — CBC
HCT: 38.8 % (ref 36.0–46.0)
Hemoglobin: 12.4 g/dL (ref 12.0–15.0)
MCH: 27.5 pg (ref 26.0–34.0)
MCHC: 32 g/dL (ref 30.0–36.0)
MCV: 86 fL (ref 80.0–100.0)
Platelets: 310 K/uL (ref 150–400)
RBC: 4.51 MIL/uL (ref 3.87–5.11)
RDW: 13.6 % (ref 11.5–15.5)
WBC: 10.9 K/uL — ABNORMAL HIGH (ref 4.0–10.5)
nRBC: 0 % (ref 0.0–0.2)

## 2024-01-31 LAB — BASIC METABOLIC PANEL WITH GFR
Anion gap: 9 (ref 5–15)
BUN: 9 mg/dL (ref 6–20)
CO2: 26 mmol/L (ref 22–32)
Calcium: 9.4 mg/dL (ref 8.9–10.3)
Chloride: 102 mmol/L (ref 98–111)
Creatinine, Ser: 0.96 mg/dL (ref 0.44–1.00)
GFR, Estimated: >60 mL/min
Glucose, Bld: 120 mg/dL — ABNORMAL HIGH (ref 70–99)
Potassium: 3.9 mmol/L (ref 3.5–5.1)
Sodium: 138 mmol/L (ref 135–145)

## 2024-01-31 LAB — HCG, SERUM, QUALITATIVE: Preg, Serum: NEGATIVE

## 2024-01-31 LAB — LIPASE, BLOOD: Lipase: 35 U/L (ref 11–51)

## 2024-01-31 LAB — TROPONIN T, HIGH SENSITIVITY
Troponin T High Sensitivity: <15 ng/L (ref 0–19)
Troponin T High Sensitivity: <15 ng/L (ref 0–19)

## 2024-01-31 MED ORDER — OMEPRAZOLE 20 MG PO CPDR: 20.0000 mg | DELAYED_RELEASE_CAPSULE | Freq: Every day | ORAL | 0 refills | Status: DC

## 2024-01-31 MED ORDER — ALUM & MAG HYDROXIDE-SIMETH 200-200-20 MG/5ML PO SUSP
30.0000 mL | Freq: Once | ORAL | Status: AC
  Administered 2024-01-31: 30 mL via ORAL
  Filled 2024-01-31: qty 30

## 2024-01-31 MED ORDER — SUCRALFATE 1 G PO TABS: 1.0000 g | ORAL_TABLET | Freq: Three times a day (TID) | ORAL | 0 refills | Status: AC

## 2024-01-31 MED ORDER — PANTOPRAZOLE SODIUM 40 MG IV SOLR
40.0000 mg | Freq: Once | INTRAVENOUS | Status: AC
  Administered 2024-01-31: 40 mg via INTRAVENOUS
  Filled 2024-01-31: qty 10

## 2024-01-31 MED ORDER — LIDOCAINE VISCOUS HCL 2 % MT SOLN
15.0000 mL | Freq: Once | OROMUCOSAL | Status: AC
  Administered 2024-01-31: 15 mL via ORAL
  Filled 2024-01-31: qty 15

## 2024-01-31 MED ORDER — ONDANSETRON HCL 4 MG/2ML IJ SOLN
4.0000 mg | Freq: Once | INTRAMUSCULAR | Status: AC
  Administered 2024-01-31: 4 mg via INTRAVENOUS
  Filled 2024-01-31: qty 2

## 2024-01-31 MED ORDER — OMEPRAZOLE 20 MG PO CPDR: 20.0000 mg | DELAYED_RELEASE_CAPSULE | Freq: Every day | ORAL | 1 refills | Status: DC

## 2024-01-31 NOTE — ED Provider Notes (Signed)
 " [place] EMERGENCY DEPARTMENT AT [facility] Provider Note   CSN: [id number] Arrival date & time: 01/31/24  9454     Patient presents with: Chest Pain   Theresa Holt is a 38 y.o. female.    Chest Pain Patient is a 38 year old female present emergency room today with complaints of epigastric abdominal pain/chest discomfort states that it has been ongoing for numerous months and she feels that the Pepcid  she is on is not helping.  She has been taking Mylanta.   She states that she was seen in the ER a couple days ago and eloped but was called afterwards by provider and told that she had some abnormal liver enzymes.  She returned to the emergency room today with some continued symptoms of epigastric abdominal pain.     Prior to Admission medications  Medication Sig Start Date End Date Taking? Authorizing Provider  sucralfate  (CARAFATE ) 1 g tablet Take 1 tablet (1 g total) by mouth 4 (four) times daily -  with meals and at bedtime. 01/31/24  Yes Jolane Bankhead, Hamp S, PA  fluticasone  (FLONASE ) 50 MCG/ACT nasal spray Place 2 sprays into both nostrils daily. Shake well before use. Gently blow nose before spraying. Do not blow nose immediately after use. You should not taste the medication or feel it going down your throat; if you do, adjust your technique. 10/18/23   Murrill, Samantha, FNP  hydrOXYzine  (ATARAX ) 10 MG tablet Take 1-2 tablets (10-20 mg total) by mouth 2 (two) times daily as needed for anxiety (sleep). 11/17/22   Bahraini, Sarah A  ibuprofen  (ADVIL ) 600 MG tablet Take 1 tablet (600 mg total) by mouth every 6 (six) hours as needed. 10/27/23   Rising, Asberry, PA-C  letrozole (FEMARA) 2.5 MG tablet Take 2.5 mg by mouth daily. Patient not taking: Reported on 10/27/2023    [provider]  norgestimate-ethinyl estradiol (ORTHO-CYCLEN) 0.25-35 MG-MCG tablet Take 1 tablet by mouth daily. 05/15/23   [provider]  omeprazole  (PRILOSEC) 20 MG capsule Take 1  capsule (20 mg total) by mouth daily. 01/31/24   Neldon Hamp RAMAN, PA  QUEtiapine  (SEROQUEL ) 50 MG tablet Take 1 tablet (50 mg total) by mouth at bedtime. 11/17/22   Bahraini, Sarah A  semaglutide -weight management (WEGOVY ) 1.7 MG/0.75ML SOAJ SQ injection Inject 1.7 mg into the skin every 7 (seven) days. 07/20/23     semaglutide -weight management (WEGOVY ) 1.7 MG/0.75ML SOAJ SQ injection Inject 1.7 mg into the skin once a week. 10/25/23       Allergies: Codeine    Review of Systems  Cardiovascular:  Positive for chest pain.    Updated Vital Signs BP 125/77 (BP Location: Right Arm)   Pulse 71   Temp 98.6 F (37 C) (Oral)   Resp 13   Ht 5' 5 (1.651 m)   Wt 96.6 kg   LMP 12/24/2023   SpO2 100%   BMI 35.44 kg/m   Physical Exam Vitals and nursing note reviewed.  Constitutional:      General: She is not in acute distress. HENT:     Head: Normocephalic and atraumatic.     Nose: Nose normal.  Eyes:     General: No scleral icterus. Cardiovascular:     Rate and Rhythm: Normal rate and regular rhythm.     Pulses: Normal pulses.     Heart sounds: Normal heart sounds.  Pulmonary:     Effort: Pulmonary effort is normal. No respiratory distress.     Breath sounds: No wheezing.  Abdominal:     Palpations: Abdomen is soft.     Tenderness: There is no abdominal tenderness. There is no guarding or rebound.  Musculoskeletal:     Cervical back: Normal range of motion.     Right lower leg: No edema.     Left lower leg: No edema.  Skin:    General: Skin is warm and dry.     Capillary Refill: Capillary refill takes less than 2 seconds.  Neurological:     Mental Status: She is alert. Mental status is at baseline.  Psychiatric:        Mood and Affect: Mood normal.        Behavior: Behavior normal.     (all labs ordered are listed, but only abnormal results are displayed) Labs Reviewed  BASIC METABOLIC PANEL WITH GFR - Abnormal; Notable for the following components:      Result Value    Glucose, Bld 120 (*)    All other components within normal limits  CBC - Abnormal; Notable for the following components:   WBC 10.9 (*)    All other components within normal limits  HEPATIC FUNCTION PANEL - Abnormal; Notable for the following components:   AST 78 (*)    ALT 182 (*)    All other components within normal limits  HCG, SERUM, QUALITATIVE  LIPASE, BLOOD  TROPONIN T, HIGH SENSITIVITY  TROPONIN T, HIGH SENSITIVITY    EKG: EKG Interpretation Date/Time:  Thursday January 31 2024 09:29:33 EST Ventricular Rate:  77 PR Interval:  142 QRS Duration:  85 QT Interval:  404 QTC Calculation: 458 R Axis:   54  Text Interpretation: Sinus rhythm Low voltage, precordial leads Confirmed by Neysa Clap [phone]) on 01/31/2024 9:54:17 AM  Radiology: US  Abdomen Limited RUQ (LIVER/GB) Result Date: 01/31/2024 EXAM: Right Upper Quadrant Abdominal Ultrasound 01/31/2024 07:53:09 AM TECHNIQUE: Real-time ultrasonography of the right upper quadrant of the abdomen was performed. COMPARISON: CT abdomen and pelvis 12/10/2023. CLINICAL HISTORY: 38 year old female with right upper quadrant pain since 3 days. FINDINGS: LIVER: Liver length measures 14 cm. Normal echogenicity. No intrahepatic biliary ductal dilatation. No evidence of mass. Hepatopetal flow in the portal vein. BILIARY SYSTEM: Gallbladder wall thickness measures 0.2 cm. No pericholecystic fluid or wall thickening. No cholelithiasis. The common bile duct measures 0.3 cm and is nondilated. OTHER: No right upper quadrant ascites. IMPRESSION: 1. Negative right upper quadrant ultrasound. Electronically signed by: Helayne Hurst MD 01/31/2024 08:01 AM EST RP Workstation: [id number]   DG Chest 2 View Result Date: 01/31/2024 EXAM: AP AND LATERAL (2) VIEW(S) XRAY OF THE CHEST 01/31/2024 06:22:59 AM COMPARISON: AP and lateral chest 06/13/2022. CLINICAL HISTORY: cp FINDINGS: LUNGS AND PLEURA: No focal pulmonary opacity. No pleural effusion. No  pneumothorax. HEART AND MEDIASTINUM: No acute abnormality of the cardiac and mediastinal silhouettes. BONES AND SOFT TISSUES: No acute osseous abnormality. IMPRESSION: 1. No acute cardiopulmonary findings. Stable chest. Electronically signed by: Francis Quam MD 01/31/2024 06:41 AM EST RP Workstation: [id number]     Procedures   Medications Ordered in the ED  alum & mag hydroxide-simeth (MAALOX/MYLANTA) 200-200-20 MG/5ML suspension 30 mL (30 mLs Oral Given 01/31/24 0713)    And  lidocaine  (XYLOCAINE ) 2 % viscous mouth solution 15 mL (15 mLs Oral Given 01/31/24 0713)  pantoprazole  (PROTONIX ) injection 40 mg (40 mg Intravenous Given 01/31/24 0809)  ondansetron  (ZOFRAN ) injection 4 mg (4 mg Intravenous Given 01/31/24 0809)  Medical Decision Making Amount and/or Complexity of Data Reviewed Labs: ordered. Radiology: ordered.  Risk OTC drugs. Prescription drug management.   This patient presents to the ED for concern of abd pain, this involves a number of treatment options, and is a complaint that carries with it a moderate risk of complications and morbidity. A differential diagnosis was considered for the patient's symptoms which is discussed below:   The causes of generalized abdominal pain include but are not limited to AAA, mesenteric ischemia, appendicitis, diverticulitis, DKA, gastritis, gastroenteritis, AMI, nephrolithiasis, pancreatitis, peritonitis, adrenal insufficiency,lead poisoning, iron toxicity, intestinal ischemia, constipation, UTI,SBO/LBO, splenic rupture, biliary disease, IBD, IBS, PUD, or hepatitis.   Co morbidities: Discussed in HPI   Brief History:  Patient is a 38 year old female present emergency room today with complaints of epigastric abdominal pain/chest discomfort states that it has been ongoing for numerous months and she feels that the Pepcid  she is on is not helping.  She has been taking Mylanta.   She states that she  was seen in the ER a couple days ago and eloped but was called afterwards by provider and told that she had some abnormal liver enzymes.  She returned to the emergency room today with some continued symptoms of epigastric abdominal pain.    EMR reviewed including pt PMHx, past surgical history and past visits to ER.   See HPI for more details   Lab Tests:   I ordered and independently interpreted labs. Labs notable for LFTs are downtrending patient has no right upper quadrant abdominal tenderness I am overall reassured.  BMP unremarkable CBC with mild leukocytosis improved from yesterday day/2 days ago.  Lipase normal, troponin undetectable  Imaging Studies:  Right upper quadrant ultrasound was without any abnormal findings.  Normal echogenicity.  Chest x-ray without infiltrate or pneumothorax.    Cardiac Monitoring:  The patient was maintained on a cardiac monitor.  I personally viewed and interpreted the cardiac monitored which showed an underlying rhythm of: NSR EKG non-ischemic   Medicines ordered:  I ordered medication including Zofran , Protonix , GI cocktail for epigastric pain Reevaluation of the patient after these medicines showed that the patient improved I have reviewed the patients home medicines and have made adjustments as needed   Critical Interventions:     Consults/Attending Physician      Reevaluation:  After the interventions noted above I re-evaluated patient and found that they have :improved   Social Determinants of Health:      Problem List / ED Course:  Patient has epigastric abdominal pain I think she would benefit from upper endoscopy.  She has been referred to gastroenterology.  She is currently on Pepcid  only will increase her therapy to omeprazole  daily and recommend that she start taking Carafate  in 1 to 2 weeks if she is still having symptoms.  Otherwise follow-up with gastroenterology, return to the emergency room for any new or  concerning symptoms.   Dispostion:  After consideration of the diagnostic results and the patients response to treatment, I feel that the patent would benefit from close outpatient follow-up.    Final diagnoses:  Epigastric abdominal pain  Transaminitis  Atypical chest pain    ED Discharge Orders          Ordered    omeprazole  (PRILOSEC) 20 MG capsule  Daily,   Status:  Discontinued        01/31/24 1000    omeprazole  (PRILOSEC) 20 MG capsule  Daily        01/31/24  1000    sucralfate  (CARAFATE ) 1 g tablet  3 times daily with meals & bedtime        01/31/24 1003               Neldon Hamp RAMAN, [place] 01/31/24 1243    Neysa Caron PARAS, DO 01/31/24 1553  "

## 2024-01-31 NOTE — ED Triage Notes (Signed)
 Pt POV d/t CP that woke her up in the middle of the night.  Onset 0330.  Pt has hx of same - pt tearful in triage d/t pain.

## 2024-01-31 NOTE — Discharge Instructions (Addendum)
 I would like you to switch from Pepcid  to omeprazole .  I like you to follow-up with a gastroenterologist.  Since you have already been referred to 1 I would recommend following up with them.  Ultrasound does not show any gallstones or abnormal liver appearance.  I recommend holding off on ibuprofen , alcohol, spicy food for now until you follow-up with a gastroenterologist.  I have also prescribed you some Carafate  which you can take in addition to the omeprazole  as prescribed if this is not improving your symptoms.

## 2024-01-31 NOTE — ED Notes (Signed)
ED PA at BS 

## 2024-01-31 NOTE — ED Notes (Signed)
 Patient transported to Ultrasound

## 2024-02-11 ENCOUNTER — Encounter: Payer: Self-pay | Admitting: Physician Assistant

## 2024-02-11 ENCOUNTER — Other Ambulatory Visit (INDEPENDENT_AMBULATORY_CARE_PROVIDER_SITE_OTHER)

## 2024-02-11 ENCOUNTER — Ambulatory Visit: Admitting: Physician Assistant

## 2024-02-11 VITALS — BP 124/70 | HR 76 | Ht 65.0 in | Wt 213.0 lb

## 2024-02-11 DIAGNOSIS — K219 Gastro-esophageal reflux disease without esophagitis: Secondary | ICD-10-CM | POA: Diagnosis not present

## 2024-02-11 DIAGNOSIS — R1013 Epigastric pain: Secondary | ICD-10-CM

## 2024-02-11 DIAGNOSIS — K5909 Other constipation: Secondary | ICD-10-CM | POA: Diagnosis not present

## 2024-02-11 DIAGNOSIS — R7989 Other specified abnormal findings of blood chemistry: Secondary | ICD-10-CM | POA: Diagnosis not present

## 2024-02-11 DIAGNOSIS — K5904 Chronic idiopathic constipation: Secondary | ICD-10-CM

## 2024-02-11 LAB — HEPATIC FUNCTION PANEL
ALT: 17 U/L (ref 3–35)
AST: 17 U/L (ref 5–37)
Albumin: 4.2 g/dL (ref 3.5–5.2)
Alkaline Phosphatase: 97 U/L (ref 39–117)
Bilirubin, Direct: 0.1 mg/dL (ref 0.1–0.3)
Total Bilirubin: 0.4 mg/dL (ref 0.2–1.2)
Total Protein: 7.7 g/dL (ref 6.0–8.3)

## 2024-02-11 MED ORDER — PANTOPRAZOLE SODIUM 40 MG PO TBEC: 40.0000 mg | DELAYED_RELEASE_TABLET | Freq: Two times a day (BID) | ORAL | 5 refills | Status: AC

## 2024-02-11 MED ORDER — TRULANCE 3 MG PO TABS: 3.0000 mg | ORAL_TABLET | Freq: Every day | ORAL | 2 refills | Status: DC

## 2024-02-11 NOTE — Patient Instructions (Addendum)
 Your provider has requested that you go to the basement level for lab work before leaving today. Press B on the elevator. The lab is located at the first door on the left as you exit the elevator.  Your provider has ordered Di[employer] stool testing for you. You have received a kit from our office today containing all necessary supplies to complete this test. Please carefully read the stool collection instructions provided in the kit before opening the accompanying materials. In addition, be sure there is a label providing your full name and date of birth on the puritan opti-swab tube that is supplied in the kit (if you do not see a label with this information on your test tube, please make us  aware before test collection!). After completing the test, you should secure the purtian tube into the specimen biohazard bag. The [facility] E-Req sheet (including date and time of specimen collection) should be placed into the outside pocket of the specimen biohazard bag and returned to the [place] lab (basement floor of [facility]) within 3 days of collection. Please make sure to give the specimen to a staff member at the lab. DO NOT leave the specimen on the counter. If the specimen date and time (can be found in the upper right boxed portion of the sheet) are not filled out on the E-Req sheet, the test will NOT be performed.   Please follow up sooner if symptoms increase or worsen  Due to recent changes in healthcare laws, you may see the results of your imaging and laboratory studies on MyChart before your provider has had a chance to review them.  We understand that in some cases there may be results that are confusing or concerning to you. Not all laboratory results come back in the same time frame and the provider may be waiting for multiple results in order to interpret others.  Please give us  48 hours in order for your provider to thoroughly review all the results before  contacting the office for clarification of your results.   Thank you for trusting me with your gastrointestinal care!   Ellouise Console, PA-C _______________________________________________________  If your blood pressure at your visit was 140/90 or greater, please contact your primary care physician to follow up on this.  _______________________________________________________  If you are age 23 or older, your body mass index should be between 23-30. Your Body mass index is 35.44 kg/m. If this is out of the aforementioned range listed, please consider follow up with your Primary Care Provider.  If you are age 50 or younger, your body mass index should be between 19-25. Your Body mass index is 35.44 kg/m. If this is out of the aformentioned range listed, please consider follow up with your Primary Care Provider.   ________________________________________________________  The [facility] providers would like to encourage you to use MYCHART to communicate with providers for non-urgent requests or questions.  Due to long hold times on the telephone, sending your provider a message by [facility] may be a faster and more efficient way to get a response.  Please allow 48 business hours for a response.  Please remember that this is for non-urgent requests.  _______________________________________________________

## 2024-02-11 NOTE — Progress Notes (Signed)
 "     Ellouise Console, PA-C [address] [place], [place]  [address] Phone: [phone]   Gastroenterology Consultation  Referring Provider:     Jerel Gee, NP Primary Care Physician:  Jerel Gee, NP Primary Gastroenterologist:  Ellouise Console, PA-C / Glendia Holt, MD  Reason for Consultation:     ED follow-up GERD, elevated LFTs        HPI:   Discussed the use of AI scribe software for clinical note transcription with the patient, who gave verbal consent to proceed.  Theresa Holt is a 39 year old female with gastroesophageal reflux disease and chronic constipation who presents for evaluation of persistent heartburn.  New patient.  Here for ED follow-up of GERD and epigastric pain.  She went to [facility] ED 01/31/2024 to evaluate GERD, epigastric pain, and chest pain.  She has had the symptoms ongoing for many months.  Pepcid  and omeprazole  have not helped.  Has also taken Mylanta.  Normal RUQ ultrasound and CT scan.  No gallstones.  01/31/2024 RUQ ultrasound: Normal gallbladder and liver.  No acute abnormality.  No gallstones.  CBD 3 mm.  12/2023 CT abdomen pelvis with contrast: No acute abnormality.  Normal.  Component Ref Range & Units (hover) 11 d ago 13 d ago 2 mo ago 3 yr ago 4 yr ago  Total Protein 7.7 8.1 7.1 7.1 7.7  Albumin 4.0 4.1 3.3 Low  3.3 Low  3.8  AST 78 High  484 High  CM 60 High  21 19  ALT 182 High  299 High  32 19 22  Alkaline Phosphatase 126 145 High  82 77 75  Total Bilirubin 0.4 0.8 0.7 0.4 R 0.3 R    History of Present Illness Heartburn and upper gastrointestinal symptoms - Persistent daily heartburn for the past 3-4 months, occurring with swallowing and present throughout the day and night - Severe episodes of upper abdominal pain radiating into the chest, accompanied by tingling and heat in the ears, numbness and tingling in the left arm, shortness of breath, and headaches - Episodes are severe enough to require her to pull over while driving and  consider seeking emergency assistance - Frequent throat clearing resulting in throat pain - Currently experiencing heartburn  Prior diagnostic evaluation - Evaluated in the emergency department on December 25th and two additional times within a few days for severe symptoms - Right upper quadrant ultrasound on December 25th showed no gallstones but revealed elevated liver tests - CT scan of abdomen and pelvis on November 3rd was unremarkable, with normal liver, pancreas, stomach, and intestines - Recent chest x-ray was normal - No history of upper endoscopy or H. pylori testing  Current and prior pharmacologic therapy for gerd - Current medications: omeprazole  20 mg once daily and sucralfate  one tablet four times daily - Persistent daily heartburn with minimal relief from current regimen - Sucralfate  initially provided benefit but is no longer effective - Possible prior use of pantoprazole  during an ER visit - No new medications started recently  Dietary habits and fluid intake - Typically consumes one meal per day, usually a salad in the afternoon - Attempting to reduce soda intake and increase water consumption - Usually drinks only one bottle of water per day  Chronic constipation - Chronic infrequent, hard bowel movements present for many years - Linzess previously effective but caused incontinence at work - No prior use of Amitiza or Trulance  - Frequent urge to defecate but often unable to pass stool;  when able, stool is hard  Alcohol use - Alcohol consumption approximately once a month or less     Past Medical History:  Diagnosis Date   Anxiety    Chest pain    Depression    Fibromyalgia    Lower abdominal pain     Past Surgical History:  Procedure Laterality Date   BREAST REDUCTION SURGERY Bilateral 10/27/2022   Procedure: bilateral breast reduction;  Surgeon: Waddell Leonce NOVAK, MD;  Location: [facility];  Service: Plastics;  Laterality: Bilateral;    CARPAL TUNNEL RELEASE Right    CESAREAN SECTION     x 3   TUBAL LIGATION     tubal reversal      Prior to Admission medications  Medication Sig Start Date End Date Taking? Authorizing Provider  fluticasone  (FLONASE ) 50 MCG/ACT nasal spray Place 2 sprays into both nostrils daily. Shake well before use. Gently blow nose before spraying. Do not blow nose immediately after use. You should not taste the medication or feel it going down your throat; if you do, adjust your technique. 10/18/23   Iola Lukes, FNP  hydrOXYzine  (ATARAX ) 10 MG tablet Take 1-2 tablets (10-20 mg total) by mouth 2 (two) times daily as needed for anxiety (sleep). 11/17/22   Bahraini, Sarah A  ibuprofen  (ADVIL ) 600 MG tablet Take 1 tablet (600 mg total) by mouth every 6 (six) hours as needed. 10/27/23   Rising, Asberry, PA-C  letrozole (FEMARA) 2.5 MG tablet Take 2.5 mg by mouth daily. Patient not taking: Reported on 10/27/2023    [provider]  norgestimate-ethinyl estradiol (ORTHO-CYCLEN) 0.25-35 MG-MCG tablet Take 1 tablet by mouth daily. 05/15/23   [provider]  omeprazole  (PRILOSEC) 20 MG capsule Take 1 capsule (20 mg total) by mouth daily. 01/31/24   Neldon Hamp RAMAN, PA  QUEtiapine  (SEROQUEL ) 50 MG tablet Take 1 tablet (50 mg total) by mouth at bedtime. 11/17/22   Bahraini, Sarah A  semaglutide -weight management (WEGOVY ) 1.7 MG/0.75ML SOAJ SQ injection Inject 1.7 mg into the skin every 7 (seven) days. 07/20/23     semaglutide -weight management (WEGOVY ) 1.7 MG/0.75ML SOAJ SQ injection Inject 1.7 mg into the skin once a week. 10/25/23     sucralfate  (CARAFATE ) 1 g tablet Take 1 tablet (1 g total) by mouth 4 (four) times daily -  with meals and at bedtime. 01/31/24   Neldon Hamp RAMAN, PA    Family History  Problem Relation Age of Onset   Colon cancer Neg Hx    Esophageal cancer Neg Hx      Social History[1]  Allergies as of 02/11/2024 - Review Complete 02/11/2024  Allergen Reaction Noted    Codeine Itching 11/20/2013    Review of Systems:    All systems reviewed and negative except where noted in HPI.   Physical Exam:  BP 124/70   Pulse 76   Ht 5' 5 (1.651 m)   LMP 02/10/2024 (Exact Date)   BMI 35.44 kg/m  Patient's last menstrual period was 02/10/2024 (exact date).  General:   Alert,  Well-developed, well-nourished, pleasant and cooperative in NAD Lungs:  Respirations even and unlabored.  Clear throughout to auscultation.   No wheezes, crackles, or rhonchi. No acute distress. Heart:  Regular rate and rhythm; no murmurs, clicks, rubs, or gallops. Abdomen:  Normal bowel sounds.  No bruits.  Soft, and obese without masses, hepatosplenomegaly or hernias noted.  Mild epigastric and generalized abdominal tenderness.  No guarding or rebound tenderness.    Neurologic:  Alert and oriented x3;  grossly normal neurologically. Psych:  Alert and cooperative. Normal mood and affect.   Imaging Studies: US  Abdomen Limited RUQ (LIVER/GB) Result Date: 01/31/2024 EXAM: Right Upper Quadrant Abdominal Ultrasound 01/31/2024 07:53:09 AM TECHNIQUE: Real-time ultrasonography of the right upper quadrant of the abdomen was performed. COMPARISON: CT abdomen and pelvis 12/10/2023. CLINICAL HISTORY: 39 year old female with right upper quadrant pain since 3 days. FINDINGS: LIVER: Liver length measures 14 cm. Normal echogenicity. No intrahepatic biliary ductal dilatation. No evidence of mass. Hepatopetal flow in the portal vein. BILIARY SYSTEM: Gallbladder wall thickness measures 0.2 cm. No pericholecystic fluid or wall thickening. No cholelithiasis. The common bile duct measures 0.3 cm and is nondilated. OTHER: No right upper quadrant ascites. IMPRESSION: 1. Negative right upper quadrant ultrasound. Electronically signed by: Helayne Hurst MD 01/31/2024 08:01 AM EST RP Workstation: [id number]   DG Chest 2 View Result Date: 01/31/2024 EXAM: AP AND LATERAL (2) VIEW(S) XRAY OF THE CHEST 01/31/2024 06:22:59  AM COMPARISON: AP and lateral chest 06/13/2022. CLINICAL HISTORY: cp FINDINGS: LUNGS AND PLEURA: No focal pulmonary opacity. No pleural effusion. No pneumothorax. HEART AND MEDIASTINUM: No acute abnormality of the cardiac and mediastinal silhouettes. BONES AND SOFT TISSUES: No acute osseous abnormality. IMPRESSION: 1. No acute cardiopulmonary findings. Stable chest. Electronically signed by: Francis Quam MD 01/31/2024 06:41 AM EST RP Workstation: [id number]    Labs: CBC    Component Value Date/Time   WBC 10.9 (H) 01/31/2024 0609   RBC 4.51 01/31/2024 0609   HGB 12.4 01/31/2024 0609   HCT 38.8 01/31/2024 0609   PLT 310 01/31/2024 0609   MCV 86.0 01/31/2024 0609    CMP     Component Value Date/Time   NA 138 01/31/2024 0609   K 3.9 01/31/2024 0609   CL 102 01/31/2024 0609   CO2 26 01/31/2024 0609   GLUCOSE 120 (H) 01/31/2024 0609   BUN 9 01/31/2024 0609   CREATININE 0.96 01/31/2024 0609   CALCIUM 9.4 01/31/2024 0609   PROT 7.7 02/11/2024 1203   ALBUMIN 4.2 02/11/2024 1203   AST 17 02/11/2024 1203   ALT 17 02/11/2024 1203   ALKPHOS 97 02/11/2024 1203   BILITOT 0.4 02/11/2024 1203   GFRNONAA >60 01/31/2024 0609   GFRAA >60 09/13/2019 2306    Assessment and Plan:   Tatayana Glomb is a 39 y.o. y/o female has been referred for: Assessment & Plan 1.  Severe gastroesophageal reflux disease Chronic, refractory symptoms not controlled on omeprazole .  Impacting daily functioning despite therapy; imaging showed no acute pathology. - Discontinued omeprazole . - Initiated pantoprazole  40 mg twice daily, 30 minutes before meals. - Continued sucralfate  1 g 4 times daily as needed. - Provided dietary and lifestyle counseling with a written list of foods and drinks to avoid. - Ordered H. pylori stool antigen test. - Discussed possible need for upper endoscopy if symptoms persist or blood work is abnormal. - She is advised to avoid NSAIDs. - If symptoms persist in spite of treatment, then  EGD is next step.  2.  Elevated liver enzymes Unexplained elevation with normal imaging; differential includes drug-induced liver injury, viral hepatitis, autoimmune hepatitis, primary biliary cirrhosis, and hemochromatosis.  Recent abdominal ultrasound and CT showed no evidence of choledocholithiasis or cholelithiasis. - Labs: Hepatic Panal, ANA, AMA, ASMA, ceruloplasmin, viral hepatitis A/B/C, iron panel, ferritin, immunoglobulins, alpha-1 antitrypsin  - Advised avoidance of alcohol. - Advised increased hydration for blood draw. - Planned to review liver tests before considering endoscopy. - If liver enzymes become more  elevated, and above labs are unrevealing, consider MRCP.  3.  Chronic constipation Longstanding, refractory constipation with intolerance to Linzess; no prior trial of Trulance  or Amitiza. - Provided Trulance  3 Mg once daily samples and prescription. - If Trulance  is ineffective, then try Amitiza. - Instructed to report efficacy and tolerance via MyChart for possible prescription adjustment.  Follow up in 6 weeks with TG.  Also follow-up based on test results and GI symptoms.  Ellouise Console, PA-C       [1]  Social History Tobacco Use   Smoking status: Never   Smokeless tobacco: Never  Vaping Use   Vaping status: Never Used  Substance Use Topics   Alcohol use: Yes    Comment: once monthly   Drug use: No   "

## 2024-02-13 ENCOUNTER — Telehealth: Payer: Self-pay

## 2024-02-13 ENCOUNTER — Other Ambulatory Visit (HOSPITAL_COMMUNITY): Payer: Self-pay

## 2024-02-13 LAB — IRON,TIBC AND FERRITIN PANEL
%SAT: 13 % — ABNORMAL LOW (ref 16–45)
Ferritin: 71 ng/mL (ref 16–154)
Iron: 49 ug/dL (ref 40–190)
TIBC: 378 ug/dL (ref 250–450)

## 2024-02-13 LAB — HEPATITIS B CORE ANTIBODY, TOTAL: Hep B Core Total Ab: NONREACTIVE

## 2024-02-13 LAB — MITOCHONDRIAL ANTIBODIES: Mitochondrial M2 Ab, IgG: <=20 U

## 2024-02-13 LAB — CERULOPLASMIN: Ceruloplasmin: 27 mg/dL (ref 14–48)

## 2024-02-13 LAB — ANTI-SMOOTH MUSCLE ANTIBODY, IGG: Actin (Smooth Muscle) Antibody (IGG): <20 U

## 2024-02-13 LAB — HEPATITIS B SURFACE ANTIBODY,QUALITATIVE: Hep B S Ab: NONREACTIVE

## 2024-02-13 LAB — ALPHA-1-ANTITRYPSIN: A-1 Antitrypsin, Ser: 157 mg/dL (ref 83–199)

## 2024-02-13 LAB — HEPATITIS B SURFACE ANTIGEN: Hepatitis B Surface Ag: NONREACTIVE

## 2024-02-13 LAB — HEPATITIS C ANTIBODY: Hepatitis C Ab: NONREACTIVE

## 2024-02-13 LAB — HEPATITIS A ANTIBODY, IGM: Hep A IgM: NONREACTIVE

## 2024-02-13 LAB — ANA: Anti Nuclear Antibody (ANA): NEGATIVE

## 2024-02-13 LAB — HEPATITIS A ANTIBODY, TOTAL: Hepatitis A AB,Total: NONREACTIVE

## 2024-02-13 NOTE — Telephone Encounter (Signed)
 Pharmacy Patient Advocate Encounter   Received notification from CoverMyMeds that prior authorization for Trulance  3MG  tablets is required/requested.   Insurance verification completed.   The patient is insured through [facility] MEDICAID.   Per test claim: PA required; PA submitted to above mentioned insurance via Latent Key/confirmation #/EOC [id number] Status is pending

## 2024-02-14 ENCOUNTER — Ambulatory Visit: Payer: Self-pay | Admitting: Physician Assistant

## 2024-02-14 LAB — IMMUNOGLOBULINS A/E/G/M, SERUM
IgE (Immunoglobulin E), Serum: 101 [IU]/mL (ref 6–495)
IgG (Immunoglobin G), Serum: 1401 mg/dL (ref 586–1602)
IgM (Immunoglobulin M), Srm: 136 mg/dL (ref 26–217)
Immunoglobulin A, (IgA) QN, Serum: 646 mg/dL — ABNORMAL HIGH (ref 87–352)

## 2024-02-14 NOTE — Progress Notes (Signed)
 I sent patient results through MyChart. -Please offer patient hepatitis A and B vaccines. Ellouise Console, PA-C

## 2024-02-15 NOTE — Telephone Encounter (Signed)
 PA team-  Can you help with Trulance  PA please?

## 2024-02-18 MED ORDER — LUBIPROSTONE 24 MCG PO CAPS: 24.0000 ug | ORAL_CAPSULE | Freq: Two times a day (BID) | ORAL | 3 refills | Status: AC

## 2024-02-18 NOTE — Telephone Encounter (Signed)
 Theresa Holt- Please advise. Appears insurance requires the use of Amitiza  before covering Trulance .

## 2024-02-18 NOTE — Telephone Encounter (Signed)
 Pharmacy Patient Advocate Encounter   Prior Authorization form/request asks a question that requires your assistance. Please see the question below and advise accordingly. The PA will not be submitted until the necessary information is received.     Alternatives are:  Linzess Lubiprostone  (Amitiza )

## 2024-02-21 ENCOUNTER — Ambulatory Visit (INDEPENDENT_AMBULATORY_CARE_PROVIDER_SITE_OTHER)

## 2024-02-21 DIAGNOSIS — Z23 Encounter for immunization: Secondary | ICD-10-CM | POA: Diagnosis not present

## 2024-02-24 NOTE — Progress Notes (Signed)
 Agree with the assessment and plan as outlined by Brigitte Canard, PA-C.

## 2024-03-06 ENCOUNTER — Other Ambulatory Visit (HOSPITAL_BASED_OUTPATIENT_CLINIC_OR_DEPARTMENT_OTHER): Payer: Self-pay

## 2024-03-06 MED ORDER — WEGOVY 0.25 MG/0.5ML ~~LOC~~ SOAJ
0.2500 mg | SUBCUTANEOUS | 0 refills | Status: AC
  Filled 2024-03-06: qty 2, 28d supply, fill #0

## 2024-03-24 ENCOUNTER — Ambulatory Visit: Admitting: Physician Assistant

## 2024-03-25 ENCOUNTER — Ambulatory Visit: Admitting: Physician Assistant
